# Patient Record
Sex: Female | Born: 1945 | Race: White | Hispanic: No | Smoking: Former smoker
Health system: Southern US, Community
[De-identification: ages and names within clinical notes are randomized; demographics above are authoritative.]

## PROBLEM LIST (undated history)

## (undated) DIAGNOSIS — I214 Non-ST elevation (NSTEMI) myocardial infarction: Secondary | ICD-10-CM

## (undated) DIAGNOSIS — E039 Hypothyroidism, unspecified: Secondary | ICD-10-CM

## (undated) DIAGNOSIS — I10 Essential (primary) hypertension: Secondary | ICD-10-CM

## (undated) DIAGNOSIS — R011 Cardiac murmur, unspecified: Secondary | ICD-10-CM

## (undated) DIAGNOSIS — R0602 Shortness of breath: Secondary | ICD-10-CM

## (undated) DIAGNOSIS — M1990 Unspecified osteoarthritis, unspecified site: Secondary | ICD-10-CM

## (undated) DIAGNOSIS — R911 Solitary pulmonary nodule: Secondary | ICD-10-CM

## (undated) DIAGNOSIS — H269 Unspecified cataract: Secondary | ICD-10-CM

## (undated) DIAGNOSIS — K219 Gastro-esophageal reflux disease without esophagitis: Secondary | ICD-10-CM

## (undated) DIAGNOSIS — E669 Obesity, unspecified: Secondary | ICD-10-CM

## (undated) DIAGNOSIS — J449 Chronic obstructive pulmonary disease, unspecified: Secondary | ICD-10-CM

## (undated) DIAGNOSIS — I5181 Takotsubo syndrome: Secondary | ICD-10-CM

## (undated) DIAGNOSIS — E785 Hyperlipidemia, unspecified: Secondary | ICD-10-CM

## (undated) HISTORY — PX: KNEE ARTHROSCOPY: SUR90

## (undated) HISTORY — DX: Takotsubo syndrome: I51.81

## (undated) HISTORY — DX: Unspecified cataract: H26.9

## (undated) HISTORY — PX: COLONOSCOPY: SHX174

## (undated) HISTORY — PX: BLADDER SUSPENSION: SHX72

## (undated) HISTORY — DX: Solitary pulmonary nodule: R91.1

## (undated) HISTORY — DX: Non-ST elevation (NSTEMI) myocardial infarction: I21.4

## (undated) HISTORY — DX: Unspecified osteoarthritis, unspecified site: M19.90

---
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
## 1967-08-28 HISTORY — PX: TONSILLECTOMY: SUR1361

## 1991-08-28 HISTORY — PX: PARTIAL HYSTERECTOMY: SHX80

## 2000-01-10 ENCOUNTER — Emergency Department (HOSPITAL_COMMUNITY): Admission: EM | Admit: 2000-01-10 | Discharge: 2000-01-10 | Payer: Self-pay | Admitting: Emergency Medicine

## 2001-01-24 ENCOUNTER — Encounter: Admission: RE | Admit: 2001-01-24 | Discharge: 2001-01-24 | Payer: Self-pay | Admitting: Emergency Medicine

## 2001-01-24 ENCOUNTER — Encounter: Payer: Self-pay | Admitting: Emergency Medicine

## 2001-03-05 ENCOUNTER — Encounter: Payer: Self-pay | Admitting: Emergency Medicine

## 2001-03-05 ENCOUNTER — Encounter: Admission: RE | Admit: 2001-03-05 | Discharge: 2001-03-05 | Payer: Self-pay | Admitting: Emergency Medicine

## 2001-09-30 ENCOUNTER — Encounter: Payer: Self-pay | Admitting: Emergency Medicine

## 2001-09-30 ENCOUNTER — Emergency Department (HOSPITAL_COMMUNITY): Admission: EM | Admit: 2001-09-30 | Discharge: 2001-09-30 | Payer: Self-pay | Admitting: Emergency Medicine

## 2001-11-24 ENCOUNTER — Encounter: Payer: Self-pay | Admitting: Emergency Medicine

## 2001-11-24 ENCOUNTER — Encounter: Admission: RE | Admit: 2001-11-24 | Discharge: 2001-11-24 | Payer: Self-pay | Admitting: Emergency Medicine

## 2002-11-16 ENCOUNTER — Encounter: Payer: Self-pay | Admitting: Internal Medicine

## 2002-11-16 ENCOUNTER — Inpatient Hospital Stay (HOSPITAL_COMMUNITY): Admission: EM | Admit: 2002-11-16 | Discharge: 2002-11-18 | Payer: Self-pay | Admitting: Emergency Medicine

## 2002-12-29 ENCOUNTER — Encounter: Admission: RE | Admit: 2002-12-29 | Discharge: 2002-12-29 | Payer: Self-pay | Admitting: Emergency Medicine

## 2002-12-29 ENCOUNTER — Encounter: Payer: Self-pay | Admitting: Emergency Medicine

## 2004-01-27 ENCOUNTER — Encounter: Admission: RE | Admit: 2004-01-27 | Discharge: 2004-01-27 | Payer: Self-pay | Admitting: Emergency Medicine

## 2005-10-04 ENCOUNTER — Encounter: Admission: RE | Admit: 2005-10-04 | Discharge: 2005-10-04 | Payer: Self-pay | Admitting: Emergency Medicine

## 2008-11-30 ENCOUNTER — Encounter (INDEPENDENT_AMBULATORY_CARE_PROVIDER_SITE_OTHER): Payer: Self-pay | Admitting: *Deleted

## 2009-01-23 ENCOUNTER — Encounter: Admission: RE | Admit: 2009-01-23 | Discharge: 2009-01-23 | Payer: Self-pay | Admitting: Orthopedic Surgery

## 2009-06-28 ENCOUNTER — Encounter: Admission: RE | Admit: 2009-06-28 | Discharge: 2009-06-28 | Payer: Self-pay | Admitting: Family Medicine

## 2009-08-27 DIAGNOSIS — I509 Heart failure, unspecified: Secondary | ICD-10-CM

## 2009-08-27 DIAGNOSIS — I214 Non-ST elevation (NSTEMI) myocardial infarction: Secondary | ICD-10-CM

## 2009-08-27 HISTORY — DX: Non-ST elevation (NSTEMI) myocardial infarction: I21.4

## 2009-08-27 HISTORY — PX: LUMBAR LAMINECTOMY: SHX95

## 2009-08-27 HISTORY — DX: Heart failure, unspecified: I50.9

## 2009-09-09 ENCOUNTER — Telehealth: Payer: Self-pay | Admitting: Internal Medicine

## 2009-09-15 ENCOUNTER — Ambulatory Visit: Payer: Self-pay | Admitting: Vascular Surgery

## 2010-02-06 ENCOUNTER — Encounter (INDEPENDENT_AMBULATORY_CARE_PROVIDER_SITE_OTHER): Payer: Self-pay | Admitting: *Deleted

## 2010-03-01 ENCOUNTER — Inpatient Hospital Stay (HOSPITAL_COMMUNITY): Admission: RE | Admit: 2010-03-01 | Discharge: 2010-03-03 | Payer: Self-pay | Admitting: Neurosurgery

## 2010-03-08 ENCOUNTER — Telehealth (INDEPENDENT_AMBULATORY_CARE_PROVIDER_SITE_OTHER): Payer: Self-pay | Admitting: *Deleted

## 2010-03-08 ENCOUNTER — Encounter (INDEPENDENT_AMBULATORY_CARE_PROVIDER_SITE_OTHER): Payer: Self-pay | Admitting: *Deleted

## 2010-04-06 ENCOUNTER — Encounter: Admission: RE | Admit: 2010-04-06 | Discharge: 2010-04-06 | Payer: Self-pay | Admitting: Neurosurgery

## 2010-06-14 ENCOUNTER — Encounter: Admission: RE | Admit: 2010-06-14 | Discharge: 2010-06-14 | Payer: Self-pay | Admitting: Neurosurgery

## 2010-08-17 ENCOUNTER — Encounter
Admission: RE | Admit: 2010-08-17 | Discharge: 2010-08-17 | Payer: Self-pay | Source: Home / Self Care | Attending: Neurosurgery | Admitting: Neurosurgery

## 2010-08-27 HISTORY — PX: BACK SURGERY: SHX140

## 2010-08-27 HISTORY — PX: CARDIAC CATHETERIZATION: SHX172

## 2010-09-17 ENCOUNTER — Encounter: Payer: Self-pay | Admitting: Family Medicine

## 2010-09-18 ENCOUNTER — Encounter: Payer: Self-pay | Admitting: Critical Care Medicine

## 2010-09-18 ENCOUNTER — Ambulatory Visit
Admission: RE | Admit: 2010-09-18 | Discharge: 2010-09-18 | Payer: Self-pay | Source: Home / Self Care | Attending: Critical Care Medicine | Admitting: Critical Care Medicine

## 2010-09-18 DIAGNOSIS — I10 Essential (primary) hypertension: Secondary | ICD-10-CM | POA: Insufficient documentation

## 2010-09-18 DIAGNOSIS — J449 Chronic obstructive pulmonary disease, unspecified: Secondary | ICD-10-CM

## 2010-09-18 DIAGNOSIS — J8410 Pulmonary fibrosis, unspecified: Secondary | ICD-10-CM | POA: Insufficient documentation

## 2010-09-18 DIAGNOSIS — J4489 Other specified chronic obstructive pulmonary disease: Secondary | ICD-10-CM | POA: Insufficient documentation

## 2010-09-18 DIAGNOSIS — E039 Hypothyroidism, unspecified: Secondary | ICD-10-CM | POA: Insufficient documentation

## 2010-09-18 DIAGNOSIS — E785 Hyperlipidemia, unspecified: Secondary | ICD-10-CM | POA: Insufficient documentation

## 2010-09-18 DIAGNOSIS — E876 Hypokalemia: Secondary | ICD-10-CM | POA: Insufficient documentation

## 2010-09-18 HISTORY — DX: Chronic obstructive pulmonary disease, unspecified: J44.9

## 2010-09-25 ENCOUNTER — Telehealth: Payer: Self-pay | Admitting: Critical Care Medicine

## 2010-09-26 NOTE — Progress Notes (Signed)
Summary: No Show Pv  Pt No Show for Previsit.  On Id on answering machine.  Colonoscopy for 03/21/2010 cancelled. No show letter mailed to pt.

## 2010-09-26 NOTE — Letter (Signed)
Summary: Previsit letter  [facility]  [address] [place], [place] [address]   Phone: [phone]  Fax: [phone]       02/06/2010 MRN: [id number]  Carolyn Faulkner [address] [place]  [address]  [place]  Dear [name],  Welcome to the Gastroenterology Division at [facility].    You are scheduled to see a nurse for your pre-procedure visit on 03/08/2010 at 10:30am on the 3rd floor at [facility], [address]  We ask that you try to arrive at our office 15 minutes prior to your appointment time to allow for check-in.  Your nurse visit will consist of discussing your medical and surgical history, your immediate family medical history, and your medications.    Please bring a complete list of all your medications or, if you prefer, bring the medication bottles and we will list them.  We will need to be aware of both prescribed and over the counter drugs.  We will need to know exact dosage information as well.  If you are on blood thinners (Coumadin, Plavix, Aggrenox, Ticlid, etc.) please call our office today/prior to your appointment, as we need to consult with your physician about holding your medication.   Please be prepared to read and sign documents such as consent forms, a financial agreement, and acknowledgement forms.  If necessary, and with your consent, a friend or relative is welcome to sit-in on the nurse visit with you.  Please bring your insurance card so that we may make a copy of it.  If your insurance requires a referral to see a specialist, please bring your referral form from your primary care physician.  No co-pay is required for this nurse visit.     If you cannot keep your appointment, please call [phone] to cancel or reschedule prior to your appointment date.  This allows [place] the opportunity to schedule an appointment for another patient in need of care.    Thank you for choosing [facility] for your medical needs.   We appreciate the opportunity to care for you.  Please visit [place] at our website  to learn more about our practice.                     Sincerely.                                                                                                                   The Gastroenterology Division

## 2010-09-26 NOTE — Letter (Signed)
Summary: Pre Visit No Show Letter  [facility]  [address] [place], [place] [address]   Phone: [phone]  Fax: [phone]        March 08, 2010 MRN: [id number]    Carolyn Faulkner [address], [place]  [address]    Dear [name],   We have been unable to reach you by phone concerning the pre-procedure visit that you missed on 03/08/2010. For this reason,your procedure scheduled on 03/21/2010 has been cancelled. Our scheduling staff will gladly assist you with rescheduling your appointments at a more convenient time. Please call our office at [phone] between the hours of 8:00am and 5:00pm, press option #2 to reach an appointment scheduler. Please consider updating your contact numbers at this time so that we can reach you by phone in the future with schedule changes or results.    Thank you,    Ezra Sites RN [facility]

## 2010-09-26 NOTE — Progress Notes (Signed)
Summary: Schedule Colonoscopy  Phone Note Outgoing Call Call back at Home Phone [phone]   Call placed by: Harlow Mares CMA Duncan Dull),  September 09, 2009 2:24 PM Call placed to: Patient Summary of Call: patient just has knee surgery and is having back surgery at the end of the month she would like our office to call her back in a few months. i will send myself a flag and call her in June 2011 Initial call taken by: Harlow Mares CMA Duncan Dull),  September 09, 2009 2:26 PM     Appended Document: Schedule Colonoscopy called pt back and she scheduled her colonoscopy for 03/21/2010.

## 2010-09-28 NOTE — Assessment & Plan Note (Signed)
Summary: Pulmonary Consultation   Copy to:  Dr. Burnell Blanks Primary Provider/Referring Provider:  Dr. Burnell Blanks  CC:  Pulmonary Consult.Marland Kitchen  History of Present Illness: Pulmonary Consultation       The patient comes in today for an initial general pulmonary consultation.  The patient complains of cough, coughing up sputum, shortness of breath, shortness of breath with exertion, wheezing, chest pain at rest, chest pain with exertion, chest pain with inspiration/coughing, sinus pressure, nasal congestion, excessive phlegm production, sore throat, hoarseness, fever, fatigue, gasping, and choking, but denies coughing up blood, chest congestion, PND, orthopnea, pedal edema, headache, heartburn, trouble swallowing, excessive snoring, daytime hypersomnolence, non-restorative sleep, and insomnia.  Patient notes dyspnea with walking on a flat surface, making beds, and while working.  The patient describes their cough as productive, with yellow sputum, copious amount, and paroxysmal.  The symptoms appear worse with with exposure to:Marland Kitchen  Treatment to date has included short acting bronchodilators: and oral corticosteroids:.  Evaluation to date has included CXR.  ill with sinus issues b4 xmas, urgent care after xmas ?bronchitis then to PCP and cxr >>>pneumonitis mold seen in closet with leaky hot water.  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    quit smoking 2004 and was ok until 2011 had 4 bouts of bronchitis mold   had been an issue before, then recurred. now has been abated  rx pred and avelox and finished two weeks ago and helped swelling better off prednisone current symptoms resolved pcp wanted cxr checked    Preventive Screening-Counseling & Management  Alcohol-Tobacco     Smoking Status: quit > 6 months     Year Quit: 2004     Pack years: 1  Current Medications (verified): 1)  Synthroid 125 Mcg Tabs (Levothyroxine Sodium) .... Take 1 Tablet By Mouth Once A Day 2)   Zolpidem Tartrate 10 Mg Tabs (Zolpidem Tartrate) .... Take 1 Tab By Mouth At Bedtime As Needed Insomnia 3)  Hyzaar 100-25 Mg Tabs (Losartan Potassium-Hctz) .... Take 1 Tablet By Mouth Once A Day 4)  Klor-Con 10 10 Meq Cr-Tabs (Potassium Chloride) .... Take 1 Tablet By Mouth Once A Day 5)  Pravastatin Sodium 40 Mg Tabs (Pravastatin Sodium) .... 2 Tabets At Bedtime 6)  Amlodipine Besylate 5 Mg Tabs (Amlodipine Besylate) .... Take 1 Tablet By Mouth Once A Day 7)  Valium 5 Mg Tabs (Diazepam) .Marland Kitchen.. 1-2 Tablets By Mouth Every 6 Hours As Needed 8)  Ventolin Hfa 108 (90 Base) Mcg/act Aers (Albuterol Sulfate) .... 2 Puffs Every 4 Hours As Needed 9)  Cyclobenzaprine Hcl 10 Mg Tabs (Cyclobenzaprine Hcl) .... Take 1 Tablet By Mouth Three Times A Day As Needed  Allergies (verified): 1)  ! Indocin 2)  Codeine 3)  Amitriptyline Hcl 4)  Darvocet  Past History:  Past medical, surgical, family and social histories (including risk factors) reviewed, and no changes noted (except as noted below).  Past Medical History: HYPOKALEMIA (ICD-276.8) HYPERLIPIDEMIA (ICD-272.4) HYPOTHYROIDISM (ICD-244.9) HYPERTENSION (ICD-401.9)  Past Surgical History: spinal fusion - lower back bladder tack x 2 blader sling x 1 Tonsillectomy partial hysterectomy left knee surgery 1998, 2010  Family History: Reviewed history from 09/15/2010 and no changes required. Father - deceased, emphysema Mother - deceased, lung ca sister1 - heart desease, deceased sister 2 - leukemia, deceased brother 1 - lung ca, deceased brother 2 - lung and brain tumors, deceased brother 3 - pancreatic ca, deceased  Social History: Reviewed history from 09/15/2010 and no changes  required. Former smoker.  Quit 2004.  2 1/2 - 3 ppd.  Started at age 89. lives witt brother Retired from [employer] 3 childrenSmoking Status:  quit > 6 months Pack years:  46  Review of Systems       The patient complains of shortness of breath with activity,  productive cough, non-productive cough, chest pain, tooth/dental problems, nasal congestion/difficulty breathing through nose, hand/feet swelling, joint stiffness or pain, and fever.  The patient denies shortness of breath at rest, coughing up blood, irregular heartbeats, acid heartburn, indigestion, loss of appetite, weight change, abdominal pain, difficulty swallowing, sore throat, headaches, sneezing, itching, ear ache, anxiety, depression, rash, and change in color of mucus.    Vital Signs:  Patient profile:   65 year old female Height:      70 inches Weight:      255.25 pounds BMI:     36.76 O2 Sat:      96 % on Room air Temp:     98.2 degrees F oral Pulse rate:   74 / minute BP sitting:   120 / 72  (right arm) Cuff size:   large  Vitals Entered By: Gweneth Dimitri RN (September 18, 2010 11:02 AM)  O2 Flow:  Room air CC: Pulmonary Consult. Comments Medications reviewed with patient Daytime contact number verified with patient. Crystal Jones RN  September 18, 2010 11:02 AM    Physical Exam  Additional Exam:  Gen: Pleasant, well-nourished, in no distress,  normal affect ENT: No lesions,  mouth clear,  oropharynx clear, no postnasal drip Neck: No JVD, no TMG, no carotid bruits Lungs: No use of accessory muscles, no dullness to percussion, distant BS Cardiovascular: RRR, heart sounds normal, no murmur or gallops, no peripheral edema Abdomen: soft and NT, no HSM,  BS normal Musculoskeletal: No deformities, no cyanosis or clubbing Neuro: alert, non focal Skin: Warm, no lesions or rashes    CXR  Procedure date:  09/18/2010  Findings:      IMPRESSION: No acute disease.    Impression & Recommendations:  Problem # 1:  COPD (ICD-496) Assessment Unchanged Copd with obstructive defect, no evident for pneumonitis on cxr at this time plan start spiriva as needed ventolin  Medications Added to Medication List This Visit: 1)  Valium 5 Mg Tabs (Diazepam) .Marland Kitchen.. 1-2 tablets by  mouth every 6 hours as needed 2)  Cyclobenzaprine Hcl 10 Mg Tabs (Cyclobenzaprine hcl) .... Take 1 tablet by mouth three times a day as needed 3)  Spiriva Handihaler 18 Mcg Caps (Tiotropium bromide monohydrate) .... Two puffs in handihaler daily  Complete Medication List: 1)  Synthroid 125 Mcg Tabs (Levothyroxine sodium) .... Take 1 tablet by mouth once a day 2)  Zolpidem Tartrate 10 Mg Tabs (Zolpidem tartrate) .... Take 1 tab by mouth at bedtime as needed insomnia 3)  Hyzaar 100-25 Mg Tabs (Losartan potassium-hctz) .... Take 1 tablet by mouth once a day 4)  Klor-con 10 10 Meq Cr-tabs (Potassium chloride) .... Take 1 tablet by mouth once a day 5)  Pravastatin Sodium 40 Mg Tabs (Pravastatin sodium) .... 2 tabets at bedtime 6)  Amlodipine Besylate 5 Mg Tabs (Amlodipine besylate) .... Take 1 tablet by mouth once a day 7)  Valium 5 Mg Tabs (Diazepam) .Marland Kitchen.. 1-2 tablets by mouth every 6 hours as needed 8)  Ventolin Hfa 108 (90 Base) Mcg/act Aers (Albuterol sulfate) .... 2 puffs every 4 hours as needed 9)  Cyclobenzaprine Hcl 10 Mg Tabs (Cyclobenzaprine hcl) .... Take 1 tablet  by mouth three times a day as needed 10)  Spiriva Handihaler 18 Mcg Caps (Tiotropium bromide monohydrate) .... Two puffs in handihaler daily  Other Orders: Spirometry w/Graph (04540) New Patient Level V (98119) T-Fungal Panel Immuno (86612/[id number]) T-Hypersens Panel (86331/[id number]) T-2 View CXR (71020TC)  Patient Instructions: 1)  Start Spiriva daily,  one capsule, two puffs 2)  Use ventolin as needed 3)  A chest xray will be obtained 4)  Labs today for mold exposure 5)  Return 2 months  Prescriptions: SPIRIVA HANDIHALER 18 MCG  CAPS (TIOTROPIUM BROMIDE MONOHYDRATE) Two puffs in handihaler daily  #30 x 6   Entered and Authorized by:   Storm Frisk MD   Signed by:   Storm Frisk MD on 09/18/2010   Method used:   Electronically to        [facility] [phone]* (retail)       [address]       [place], [place]  [address]       Ph: [phone] or [phone]       Fax: [phone]   RxID:   [id number]    Immunization History:  Pneumovax Immunization History:    Pneumovax:  historical (02/24/2010)   Appended Document: Pulmonary Consultation fax Burnell Blanks

## 2010-10-04 NOTE — Progress Notes (Signed)
Summary: Lab results   Phone Note Outgoing Call   Reason for Call: Discuss lab or test results Summary of Call: call the pt and tell her all allergy lab reports are negative stay on medications as prescribed  Initial call taken by: Storm Frisk MD,  September 25, 2010 12:22 PM  Follow-up for Phone Call        [facility]  Gweneth Dimitri RN  September 26, 2010 11:01 AM Ms Melvyn Neth phoned stated that she was returning a call from [place] she can be reached at [phone]. Marland KitchenVedia Coffer  September 26, 2010 3:25 PM   Additional Follow-up for Phone Call Additional follow up Details #1::        called, spoke with pt.  She was informed of above lab results and recs per PW.  She verbalized understanding. Additional Follow-up by: Gweneth Dimitri RN,  September 26, 2010 4:11 PM

## 2010-11-10 ENCOUNTER — Ambulatory Visit: Payer: Self-pay | Admitting: Critical Care Medicine

## 2010-11-12 LAB — DIFFERENTIAL
Basophils Relative: 0 % (ref 0–1)
Eosinophils Relative: 1 % (ref 0–5)
Lymphocytes Relative: 27 % (ref 12–46)
Monocytes Relative: 5 % (ref 3–12)
Neutro Abs: 8 10*3/uL — ABNORMAL HIGH (ref 1.7–7.7)

## 2010-11-12 LAB — CBC
MCH: 31.5 pg (ref 26.0–34.0)
MCHC: 34.2 g/dL (ref 30.0–36.0)
Platelets: 244 10*3/uL (ref 150–400)
RDW: 12.6 % (ref 11.5–15.5)

## 2010-11-12 LAB — ABO/RH: ABO/RH(D): O POS

## 2010-11-12 LAB — SURGICAL PCR SCREEN
MRSA, PCR: NEGATIVE
Staphylococcus aureus: NEGATIVE

## 2010-11-12 LAB — TYPE AND SCREEN: Antibody Screen: NEGATIVE
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
## 2011-01-12 NOTE — Discharge Summary (Signed)
NAMEHAYVEN, Carolyn Faulkner NO.:  [id number]   MEDICAL RECORD NO.:  [id number]                   PATIENT TYPE:  INP   LOCATION:  3734                                 FACILITY:  [facility]   PHYSICIAN:  Duke Salvia, M.D. [facility]           DATE OF BIRTH:  1946-05-11   DATE OF ADMISSION:  11/16/2002  DATE OF DISCHARGE:  11/18/2002                                 DISCHARGE SUMMARY   DISCHARGE DIAGNOSIS:  Atypical chest pain.   SECONDARY DIAGNOSES:  1. Hypertension.  2. Osteoarthritis.  3. Hyperlipidemia.   HISTORY OF PRESENT ILLNESS:  This is a 65 year old female who was referred  for evaluation of chest pain over the last several months.  For six months,  the patient noted chest pressure with exertion.  She states that she  developed left-sided chest pressure walking up an incline, stops because of  the pressure.  After five minutes, it eases off.  She does not take anything  for this.  Occasionally when going up stairs, she notes similar symptoms.  The evening prior to admission, she awoke at 1 a.m. and had some chest  pressures.  Denies a bitter taste in her mouth.  Not like a burning  sensation substernally that she has with reflux.  Episode eased off on its  on the morning of admission and she felt okay.  However, a couple of times  during the day, she had chest pressure left side.  One occurred in the  waiting area where she felt somewhat nauseated and tried to make herself  belch and burp which seemed to improve.  Of note, the patient has had some  reflux and burning sensation substernally.  However, again she has no  bitterness in her mouth.  This was relieved by Rolaids, however, it is  different.  The patient is noted to drink three to six beers per day.   HOSPITAL COURSE:  She was admitted to [facility] and underwent a  cardiac catheterization which showed normal coronary and an EF of 65%.  Aortic root showed no dissection of aneurysm.  The  patient's cholesterol  level was noted to be increased at 251.  Triglycerides was 181.  HDL was 63  and LDL was 52.  Her Lipitor dose was increased from 20 mg to 40 mg daily.  The patient was also noted to be hypertensive, and she was placed on Mavik 1  mg daily for that as a proton pump inhibitor.   DISCHARGE MEDICATIONS:  She was discharged to home on all of her previous  medications:  1. Hydrochlorothiazide 25 mg daily.  2. Coated aspirin 81 mg daily.  3. Lipitor 40 mg daily.  4. Mavik one daily.  5. Protonix 40 mg daily.  6. She is to take Tylenol one or two tablets every four to six hours as     needed.  DISCHARGE INSTRUCTIONS:  No heavy lifting or strenuous activities for two  days.  No driving for two days.  Low fat, low salt, low cholesterol diet.  She was to call if she developed a lump in her groin or any drainage and  report in with scheduled visit with physician's assistant and [place] on  December 03, 2002, at 10:30 a.m.  She was scheduled an appointment with Dr.  Ladona Ridgel within one to two weeks and to have a BMET drawn at Dr. Melanee Spry  office in one week to follow her creatinine with the initiation of Mavik.  The patient was encouraged to stop the alcohol.     Chinita Pester, C.R.N.P. [facility]                 Duke Salvia, M.D. [facility]    DS/MEDQ  D:  11/18/2002  T:  11/19/2002  Job:  [id number]   cc:   Reuben Likes, M.D.  [address]  [place]  [place] [address]  Fax: [phone]   Pricilla Riffle, M.D. [facility]

## 2011-01-12 NOTE — Cardiovascular Report (Signed)
   NAMEMYCHAL, DURIO NO.:  [id number]   MEDICAL RECORD NO.:  [id number]                   PATIENT TYPE:  INP   LOCATION:  3734                                 FACILITY:  [facility]   PHYSICIAN:  Charlton Haws, M.D. [facility]              DATE OF BIRTH:  [birth year]   DATE OF PROCEDURE:  DATE OF DISCHARGE:                              CARDIAC CATHETERIZATION   PROCEDURE:  Coronary angiography.   IMPRESSION:  Chest pain, multiple coronary risk factors.  Same-day  catheterization was done from the right femoral artery.  The patient's blood  pressure was somewhat elevated during the case at 172/92.  She was given a  total of 2.5 mg of IV enalaprilat.   The left main coronary artery was normal.   The left anterior descending. Artery was normal.   The circumflex coronary artery was normal.   The right coronary artery was dominant and normal.   RAO VENTRICULOGRAPHY:  RAO ventriculography showed an EF of 65% with no MR.  There was no gradient across the aortic valve.  Aortic pressure after  enalapril was 160/90.  LV pressure was 153/60.   Because of the patient's chest pain and elevated blood pressure, an aortic  root injection was performed to rule out aneurysm and dissection.  There  showed mild aortic root dilatation but no evidence of atheroma, dissection,  or plaque.   IMPRESSION:  The patient's chest pain would appear to be noncardiac in  etiology due to her alcohol history.  It may be worthwhile to perform  outpatient endoscopy.   She needs better control of her triglycerides and lipids.  I suspect her  Lipitor can be increased to 40 mg a day.   If her blood pressure remains elevated, I would add 5 mg of Altace to her  outpatient regimen in addition to her diuretics.   If her leg heals well, she will be discharged in the morning to follow up  with Dr. Lorenz Coaster.                                               Charlton Haws, M.D. [facility]    PN/MEDQ  D:   11/17/2002  T:  11/18/2002  Job:  [id number]   cc:   Reuben Likes, M.D.  [address]  [place]  [place] [address]  Fax: [phone]   Dr. Tenny Craw

## 2011-02-19 ENCOUNTER — Other Ambulatory Visit: Payer: Self-pay | Admitting: Family Medicine

## 2011-02-19 DIAGNOSIS — Z1231 Encounter for screening mammogram for malignant neoplasm of breast: Secondary | ICD-10-CM

## 2011-02-27 ENCOUNTER — Ambulatory Visit
Admission: RE | Admit: 2011-02-27 | Discharge: 2011-02-27 | Disposition: A | Discharge status: Home / Self Care | Payer: 59 | Source: Ambulatory Visit | Attending: Family Medicine | Admitting: Family Medicine

## 2011-02-27 DIAGNOSIS — Z1231 Encounter for screening mammogram for malignant neoplasm of breast: Secondary | ICD-10-CM

## 2011-05-23 ENCOUNTER — Other Ambulatory Visit: Payer: Self-pay | Admitting: Neurosurgery

## 2011-05-23 DIAGNOSIS — M545 Low back pain: Secondary | ICD-10-CM

## 2011-05-26 ENCOUNTER — Other Ambulatory Visit: Payer: 59

## 2011-05-30 ENCOUNTER — Ambulatory Visit
Admission: RE | Admit: 2011-05-30 | Discharge: 2011-05-30 | Disposition: A | Discharge status: Home / Self Care | Payer: 59 | Source: Ambulatory Visit | Attending: Neurosurgery | Admitting: Neurosurgery

## 2011-05-30 DIAGNOSIS — M545 Low back pain: Secondary | ICD-10-CM

## 2011-05-30 MED ORDER — GADOBENATE DIMEGLUMINE 529 MG/ML IV SOLN
20.0000 mL | Freq: Once | INTRAVENOUS | Status: AC | PRN
  Administered 2011-05-30: 20 mL via INTRAVENOUS

## 2011-07-28 ENCOUNTER — Encounter: Payer: Self-pay | Admitting: *Deleted

## 2011-07-28 ENCOUNTER — Other Ambulatory Visit: Payer: Self-pay

## 2011-07-28 ENCOUNTER — Emergency Department (HOSPITAL_COMMUNITY): Payer: 59

## 2011-07-28 ENCOUNTER — Inpatient Hospital Stay (HOSPITAL_COMMUNITY)
Admission: EM | Admit: 2011-07-28 | Discharge: 2011-08-02 | DRG: 281 | Disposition: A | Discharge status: Home / Self Care | Payer: 59 | Attending: Internal Medicine | Admitting: Internal Medicine

## 2011-07-28 DIAGNOSIS — E785 Hyperlipidemia, unspecified: Secondary | ICD-10-CM | POA: Insufficient documentation

## 2011-07-28 DIAGNOSIS — M5137 Other intervertebral disc degeneration, lumbosacral region: Secondary | ICD-10-CM | POA: Diagnosis present

## 2011-07-28 DIAGNOSIS — M51379 Other intervertebral disc degeneration, lumbosacral region without mention of lumbar back pain or lower extremity pain: Secondary | ICD-10-CM | POA: Diagnosis present

## 2011-07-28 DIAGNOSIS — J440 Chronic obstructive pulmonary disease with acute lower respiratory infection: Secondary | ICD-10-CM | POA: Diagnosis present

## 2011-07-28 DIAGNOSIS — J209 Acute bronchitis, unspecified: Secondary | ICD-10-CM | POA: Diagnosis present

## 2011-07-28 DIAGNOSIS — R911 Solitary pulmonary nodule: Secondary | ICD-10-CM

## 2011-07-28 DIAGNOSIS — E079 Disorder of thyroid, unspecified: Secondary | ICD-10-CM | POA: Diagnosis present

## 2011-07-28 DIAGNOSIS — M519 Unspecified thoracic, thoracolumbar and lumbosacral intervertebral disc disorder: Secondary | ICD-10-CM | POA: Insufficient documentation

## 2011-07-28 DIAGNOSIS — I214 Non-ST elevation (NSTEMI) myocardial infarction: Principal | ICD-10-CM | POA: Diagnosis present

## 2011-07-28 DIAGNOSIS — J449 Chronic obstructive pulmonary disease, unspecified: Secondary | ICD-10-CM | POA: Insufficient documentation

## 2011-07-28 DIAGNOSIS — Z6830 Body mass index (BMI) 30.0-30.9, adult: Secondary | ICD-10-CM

## 2011-07-28 DIAGNOSIS — I10 Essential (primary) hypertension: Secondary | ICD-10-CM | POA: Diagnosis present

## 2011-07-28 DIAGNOSIS — E669 Obesity, unspecified: Secondary | ICD-10-CM | POA: Diagnosis present

## 2011-07-28 DIAGNOSIS — J8410 Pulmonary fibrosis, unspecified: Secondary | ICD-10-CM | POA: Diagnosis present

## 2011-07-28 DIAGNOSIS — Z87891 Personal history of nicotine dependence: Secondary | ICD-10-CM

## 2011-07-28 DIAGNOSIS — J4489 Other specified chronic obstructive pulmonary disease: Secondary | ICD-10-CM | POA: Insufficient documentation

## 2011-07-28 DIAGNOSIS — E039 Hypothyroidism, unspecified: Secondary | ICD-10-CM | POA: Diagnosis present

## 2011-07-28 DIAGNOSIS — R079 Chest pain, unspecified: Secondary | ICD-10-CM

## 2011-07-28 HISTORY — DX: Obesity, unspecified: E66.9

## 2011-07-28 HISTORY — DX: Hyperlipidemia, unspecified: E78.5

## 2011-07-28 HISTORY — DX: Essential (primary) hypertension: I10

## 2011-07-28 HISTORY — DX: Hypothyroidism, unspecified: E03.9

## 2011-07-28 HISTORY — DX: Chronic obstructive pulmonary disease, unspecified: J44.9

## 2011-07-28 HISTORY — DX: Shortness of breath: R06.02

## 2011-07-28 LAB — CBC
HCT: 41.1 % (ref 36.0–46.0)
Hemoglobin: 14.7 g/dL (ref 12.0–15.0)
MCH: 30.8 pg (ref 26.0–34.0)
MCHC: 35.8 g/dL (ref 30.0–36.0)
MCHC: 35.5 g/dL (ref 30.0–36.0)
MCV: 86 fL (ref 78.0–100.0)
Platelets: 205 10*3/uL (ref 150–400)
Platelets: 243 10*3/uL (ref 150–400)
RBC: 4.78 MIL/uL (ref 3.87–5.11)
RDW: 12.4 % (ref 11.5–15.5)
RDW: 12.6 % (ref 11.5–15.5)
WBC: 14.1 10*3/uL — ABNORMAL HIGH (ref 4.0–10.5)

## 2011-07-28 LAB — COMPREHENSIVE METABOLIC PANEL
Albumin: 3.9 g/dL (ref 3.5–5.2)
Alkaline Phosphatase: 115 U/L (ref 39–117)
BUN: 9 mg/dL (ref 6–23)
Potassium: 3.7 mEq/L (ref 3.5–5.1)
Sodium: 138 mEq/L (ref 135–145)
Total Protein: 7.6 g/dL (ref 6.0–8.3)

## 2011-07-28 LAB — BASIC METABOLIC PANEL
BUN: 10 mg/dL (ref 6–23)
CO2: 24 mEq/L (ref 19–32)
Calcium: 9.3 mg/dL (ref 8.4–10.5)
Chloride: 103 mEq/L (ref 96–112)
Creatinine, Ser: 0.67 mg/dL (ref 0.50–1.10)
GFR calc Af Amer: >90 mL/min (ref >90)
GFR calc non Af Amer: >90 mL/min (ref >90)
Glucose, Bld: 117 mg/dL — ABNORMAL HIGH (ref 70–99)
Potassium: 3.2 mEq/L — ABNORMAL LOW (ref 3.5–5.1)
Sodium: 141 mEq/L (ref 135–145)

## 2011-07-28 LAB — DIFFERENTIAL
Basophils Absolute: 0 10*3/uL (ref 0.0–0.1)
Basophils Relative: 0 % (ref 0–1)
Monocytes Absolute: 1 10*3/uL (ref 0.1–1.0)
Neutro Abs: 12.9 10*3/uL — ABNORMAL HIGH (ref 1.7–7.7)
Neutrophils Relative %: 76 % (ref 43–77)

## 2011-07-28 LAB — CARDIAC PANEL(CRET KIN+CKTOT+MB+TROPI)
Relative Index: 10.9 — ABNORMAL HIGH (ref 0.0–2.5)
Total CK: 114 U/L (ref 7–177)
Troponin I: 1.67 ng/mL (ref <0.30)

## 2011-07-28 LAB — APTT: aPTT: 30 seconds (ref 24–37)

## 2011-07-28 LAB — PROTIME-INR: INR: 1 (ref 0.00–1.49)

## 2011-07-28 LAB — TROPONIN I: Troponin I: <0.3 ng/mL (ref <0.30)

## 2011-07-28 MED ORDER — LEVOTHYROXINE SODIUM 125 MCG PO TABS
125.0000 ug | ORAL_TABLET | Freq: Every day | ORAL | Status: DC
  Administered 2011-07-29 – 2011-08-02 (×5): 125 ug via ORAL
  Filled 2011-07-28 (×6): qty 1

## 2011-07-28 MED ORDER — ZOLPIDEM TARTRATE 5 MG PO TABS
5.0000 mg | ORAL_TABLET | Freq: Every evening | ORAL | Status: DC | PRN
  Administered 2011-07-31 – 2011-08-01 (×2): 5 mg via ORAL
  Filled 2011-07-28 (×3): qty 1

## 2011-07-28 MED ORDER — ASPIRIN EC 81 MG PO TBEC
81.0000 mg | DELAYED_RELEASE_TABLET | Freq: Every day | ORAL | Status: DC
  Administered 2011-07-29 – 2011-08-02 (×4): 81 mg via ORAL
  Filled 2011-07-28 (×5): qty 1

## 2011-07-28 MED ORDER — POTASSIUM CHLORIDE 20 MEQ/15ML (10%) PO LIQD
ORAL | Status: AC
  Administered 2011-07-28: 40 meq via ORAL
  Filled 2011-07-28: qty 30

## 2011-07-28 MED ORDER — LOSARTAN POTASSIUM 50 MG PO TABS
100.0000 mg | ORAL_TABLET | Freq: Every day | ORAL | Status: DC
  Administered 2011-07-29 – 2011-08-02 (×5): 100 mg via ORAL
  Filled 2011-07-28 (×6): qty 2

## 2011-07-28 MED ORDER — NITROGLYCERIN 0.4 MG SL SUBL: 0.4000 mg | SUBLINGUAL_TABLET | SUBLINGUAL | Status: DC | PRN

## 2011-07-28 MED ORDER — POTASSIUM CHLORIDE 20 MEQ/15ML (10%) PO LIQD
40.0000 meq | Freq: Every day | ORAL | Status: DC
  Administered 2011-07-28 – 2011-07-29 (×2): 40 meq via ORAL
  Filled 2011-07-28: qty 30

## 2011-07-28 MED ORDER — POTASSIUM CHLORIDE CRYS ER 20 MEQ PO TBCR: 20.0000 meq | EXTENDED_RELEASE_TABLET | Freq: Two times a day (BID) | ORAL | Status: DC

## 2011-07-28 MED ORDER — ALBUTEROL SULFATE HFA 108 (90 BASE) MCG/ACT IN AERS
2.0000 | INHALATION_SPRAY | Freq: Four times a day (QID) | RESPIRATORY_TRACT | Status: DC | PRN
  Administered 2011-07-28: 2 via RESPIRATORY_TRACT
  Filled 2011-07-28: qty 6.7

## 2011-07-28 MED ORDER — HYDROCHLOROTHIAZIDE 25 MG PO TABS
25.0000 mg | ORAL_TABLET | Freq: Every day | ORAL | Status: DC
  Administered 2011-07-29 – 2011-08-01 (×4): 25 mg via ORAL
  Filled 2011-07-28 (×5): qty 1

## 2011-07-28 MED ORDER — OXYCODONE-ACETAMINOPHEN 5-325 MG PO TABS: 1.0000 | ORAL_TABLET | ORAL | Status: DC | PRN

## 2011-07-28 MED ORDER — SIMVASTATIN 40 MG PO TABS
40.0000 mg | ORAL_TABLET | Freq: Every day | ORAL | Status: DC
  Administered 2011-07-29 – 2011-08-01 (×4): 40 mg via ORAL
  Filled 2011-07-28 (×5): qty 1

## 2011-07-28 MED ORDER — TIOTROPIUM BROMIDE MONOHYDRATE 18 MCG IN CAPS
18.0000 ug | ORAL_CAPSULE | Freq: Every day | RESPIRATORY_TRACT | Status: DC
  Administered 2011-07-29 – 2011-08-01 (×3): 18 ug via RESPIRATORY_TRACT
  Filled 2011-07-28: qty 5

## 2011-07-28 MED ORDER — ACETAMINOPHEN 325 MG PO TABS
325.0000 mg | ORAL_TABLET | Freq: Four times a day (QID) | ORAL | Status: DC | PRN
  Administered 2011-07-29 (×2): 325 mg via ORAL
  Filled 2011-07-28 (×3): qty 1

## 2011-07-28 MED ORDER — HEPARIN BOLUS VIA INFUSION
4000.0000 [IU] | Freq: Once | INTRAVENOUS | Status: AC
  Administered 2011-07-28: 4000 [IU] via INTRAVENOUS
  Filled 2011-07-28: qty 4000

## 2011-07-28 MED ORDER — DOXYCYCLINE HYCLATE 100 MG PO TABS
100.0000 mg | ORAL_TABLET | Freq: Every day | ORAL | Status: DC
  Administered 2011-07-28 – 2011-07-29 (×2): 100 mg via ORAL
  Filled 2011-07-28 (×2): qty 1

## 2011-07-28 MED ORDER — HEPARIN SOD (PORCINE) IN D5W 100 UNIT/ML IV SOLN
1300.0000 [IU]/h | INTRAVENOUS | Status: DC
  Administered 2011-07-28: 1200 [IU]/h via INTRAVENOUS
  Administered 2011-07-29: 1300 [IU]/h via INTRAVENOUS
  Filled 2011-07-28 (×4): qty 250

## 2011-07-28 MED ORDER — ASPIRIN 81 MG PO CHEW: 324.0000 mg | CHEWABLE_TABLET | ORAL | Status: AC

## 2011-07-28 MED ORDER — LOSARTAN POTASSIUM-HCTZ 100-25 MG PO TABS: 1.0000 | ORAL_TABLET | Freq: Every day | ORAL | Status: DC

## 2011-07-28 MED ORDER — MORPHINE SULFATE 4 MG/ML IJ SOLN
4.0000 mg | Freq: Once | INTRAMUSCULAR | Status: AC
  Administered 2011-07-28: 4 mg via INTRAVENOUS
  Filled 2011-07-28: qty 1

## 2011-07-28 MED ORDER — ONDANSETRON HCL 4 MG/2ML IJ SOLN: 4.0000 mg | Freq: Four times a day (QID) | INTRAMUSCULAR | Status: DC | PRN

## 2011-07-28 MED ORDER — ASPIRIN 300 MG RE SUPP
300.0000 mg | RECTAL | Status: AC
  Filled 2011-07-28: qty 1

## 2011-07-28 NOTE — ED Notes (Signed)
EMS VSS 120-140 sbp

## 2011-07-28 NOTE — ED Notes (Signed)
Pt is eating at this time. Pt stated that her pain was coming back. RN has been informed.

## 2011-07-28 NOTE — ED Notes (Signed)
Non productive cough for a few weeks.

## 2011-07-28 NOTE — ED Notes (Signed)
Patient is resting comfortably. Pt moved from pda to yellow. No distress noted. Food has been ordered for pt. She is aware of delay, waiting on bed. resp e/u. Skin w/d. Mae 4.
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
## 2011-07-28 NOTE — Progress Notes (Signed)
Rhonda Barrett notified of Troponin 1.67.  Patient currently pain free.  Awaiting heparin gtt from pharmacy.  Will continue to monitor.  Alonza Bogus 07/28/11 10:18 PM

## 2011-07-28 NOTE — Progress Notes (Signed)
ANTICOAGULATION CONSULT NOTE - Initial Consult  Pharmacy Consult for Heparin Indication: Chest pain  Allergies  Allergen Reactions  . Amitriptyline Hcl     REACTION: rash  . Codeine     REACTION: nausea, itching  . Hydrocodone Itching  . Indomethacin     REACTION: extreme pain  . Propoxyphene N-Acetaminophen     REACTION: itching    Patient Measurements: Height: 5\' 9"  (175.3 cm) Weight: 236 lb 5.3 oz (107.2 kg) IBW/kg (Calculated) : 66.2  Adjusted weight: 90kg  Vital Signs: Temp: 97.6 F (36.4 C) (12/01 2010) Temp src: Oral (12/01 2010) BP: 141/92 mmHg (12/01 2010) Pulse Rate: 88  (12/01 2010)  Labs:  [facility] 07/28/11 1238  HGB 14.7  HCT 41.1  PLT 205  APTT --  LABPROT --  INR --  HEPARINUNFRC --  CREATININE 0.67  CKTOTAL --  CKMB --  TROPONINI <0.30   Estimated Creatinine Clearance: 92.6 ml/min (by C-G formula based on Cr of 0.67).  Medical History: Past Medical History  Diagnosis Date  . COPD (chronic obstructive pulmonary disease)   . Thyroid disease   . Hypertension   . Hyperlipidemia   . Hypokalemia   . Obesity (BMI 30-39.9)   . COPD 09/18/2010  . Hyperlipidemia   . Hypertension   . Hypokalemia     Medications:  Scheduled:    . aspirin  324 mg Oral NOW   Or  . aspirin  300 mg Rectal NOW  . aspirin EC  81 mg Oral Daily  . doxycycline  100 mg Oral Daily  . hydrochlorothiazide  25 mg Oral Daily  . levothyroxine  125 mcg Oral Daily  . losartan  100 mg Oral Daily  .  morphine injection  4 mg Intravenous Once  . potassium chloride  40 mEq Oral Daily  . potassium chloride  20 mEq Oral BID  . simvastatin  40 mg Oral q1800  . tiotropium  18 mcg Inhalation Daily  . DISCONTD: losartan-hydrochlorothiazide  1 tablet Oral Daily    Assessment: 65yo female admitted with chest pain.  There are no baseline coags, but no h/o bleeding problems.  CBC is wnl (except incr WBC & recent URI).  Goal of Therapy:  Heparin level 0.3-0.7 units/ml   Plan:   1.  Heparin 4000 units IV x 1, then 1200 units/hr 2.  Check HL & CBC 6hr 3.  Daily HL, CBC  Ilean Spradlin P 07/28/2011,8:54 PM

## 2011-07-28 NOTE — ED Notes (Signed)
Pt placed on monitor, cont. Pulse ox. Dr.Kohut to eval ecg.

## 2011-07-28 NOTE — ED Notes (Signed)
Pt return from xray. Placed back on monitor.
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
## 2011-07-29 ENCOUNTER — Other Ambulatory Visit: Payer: Self-pay

## 2011-07-29 DIAGNOSIS — R079 Chest pain, unspecified: Secondary | ICD-10-CM

## 2011-07-29 LAB — BASIC METABOLIC PANEL
BUN: 8 mg/dL (ref 6–23)
CO2: 28 mEq/L (ref 19–32)
Calcium: 9.6 mg/dL (ref 8.4–10.5)
Calcium: 9.5 mg/dL (ref 8.4–10.5)
Chloride: 101 mEq/L (ref 96–112)
Creatinine, Ser: 0.71 mg/dL (ref 0.50–1.10)
GFR calc Af Amer: >90 mL/min (ref >90)
Glucose, Bld: 124 mg/dL — ABNORMAL HIGH (ref 70–99)
Sodium: 136 mEq/L (ref 135–145)

## 2011-07-29 LAB — CARDIAC PANEL(CRET KIN+CKTOT+MB+TROPI)
CK, MB: 8.1 ng/mL (ref 0.3–4.0)
Relative Index: 11 — ABNORMAL HIGH (ref 0.0–2.5)
Relative Index: INVALID (ref 0.0–2.5)
Total CK: 115 U/L (ref 7–177)
Total CK: 84 U/L (ref 7–177)
Troponin I: 1.57 ng/mL (ref <0.30)
Troponin I: 0.88 ng/mL (ref <0.30)

## 2011-07-29 LAB — D-DIMER, QUANTITATIVE: D-Dimer, Quant: 0.6 ug/mL-FEU — ABNORMAL HIGH (ref 0.00–0.48)

## 2011-07-29 LAB — CBC
HCT: 44.1 % (ref 36.0–46.0)
HCT: 42.9 % (ref 36.0–46.0)
Hemoglobin: 14.9 g/dL (ref 12.0–15.0)
MCH: 30 pg (ref 26.0–34.0)
MCHC: 34.7 g/dL (ref 30.0–36.0)
MCV: 87.3 fL (ref 78.0–100.0)
RBC: 5.05 MIL/uL (ref 3.87–5.11)
RDW: 12.8 % (ref 11.5–15.5)
RDW: 12.6 % (ref 11.5–15.5)
WBC: 16.1 10*3/uL — ABNORMAL HIGH (ref 4.0–10.5)

## 2011-07-29 LAB — PRO B NATRIURETIC PEPTIDE: Pro B Natriuretic peptide (BNP): 5042 pg/mL — ABNORMAL HIGH (ref 0–125)

## 2011-07-29 LAB — TSH: TSH: 0.589 u[IU]/mL (ref 0.350–4.500)

## 2011-07-29 MED ORDER — DOXYCYCLINE HYCLATE 100 MG PO TABS
100.0000 mg | ORAL_TABLET | Freq: Two times a day (BID) | ORAL | Status: DC
  Administered 2011-07-29 – 2011-08-02 (×8): 100 mg via ORAL
  Filled 2011-07-29 (×11): qty 1

## 2011-07-29 MED ORDER — METOPROLOL TARTRATE 12.5 MG HALF TABLET
12.5000 mg | ORAL_TABLET | Freq: Four times a day (QID) | ORAL | Status: DC
  Administered 2011-07-29 – 2011-08-02 (×16): 12.5 mg via ORAL
  Filled 2011-07-29 (×20): qty 1

## 2011-07-29 MED ORDER — POTASSIUM CHLORIDE CRYS ER 20 MEQ PO TBCR
40.0000 meq | EXTENDED_RELEASE_TABLET | Freq: Every day | ORAL | Status: DC
  Administered 2011-07-30 – 2011-08-02 (×4): 40 meq via ORAL
  Filled 2011-07-29 (×4): qty 2

## 2011-07-29 MED ORDER — ALUM & MAG HYDROXIDE-SIMETH 200-200-20 MG/5ML PO SUSP
15.0000 mL | ORAL | Status: DC | PRN
  Administered 2011-07-29 – 2011-08-01 (×3): 30 mL via ORAL
  Filled 2011-07-29 (×3): qty 30

## 2011-07-29 NOTE — Progress Notes (Signed)
Subjective: Patinet had episode of signif SOB last night when got to floor.  Does get SOB now walking to commode. NOtes slight heaviness on chest.  NOt like last night.  Notes intermittent pain in back between shoulder blades.  Better if she sits up.   Objective: Filed Vitals:   07/28/11 2147 07/28/11 2218 07/29/11 0540 07/29/11 0731  BP: 151/99  118/80   Pulse: 96  91   Temp: 97.6 F (36.4 C)  98.6 F (37 C)   TempSrc: Oral  Oral   Resp: 21  19   Height:      Weight:      SpO2: 96% 94% 96% 94%   Weight change:   Intake/Output Summary (Last 24 hours) at 07/29/11 1610 Last data filed at 07/29/11 0700  Gross per 24 hour  Intake    880 ml  Output    600 ml  Net    280 ml    General: Alert, awake, oriented x3, in no acute distress.  Neck;  No JVD LUngs  Relatively clear after cough. Cardiac:  RRR  S1, S2,  No murmurs Ext:  No edema  Lab Results: Results for orders placed during the hospital encounter of 07/28/11 (from the past 24 hour(s))  CBC     Status: Abnormal   Collection Time   07/28/11 12:38 PM      Component Value Range   WBC 14.1 (*) 4.0 - 10.5 (K/uL)   RBC 4.78  3.87 - 5.11 (MIL/uL)   Hemoglobin 14.7  12.0 - 15.0 (g/dL)   HCT 96.0  45.4 - 09.8 (%)   MCV 86.0  78.0 - 100.0 (fL)   MCH 30.8  26.0 - 34.0 (pg)   MCHC 35.8  30.0 - 36.0 (g/dL)   RDW 11.9  14.7 - 82.9 (%)   Platelets 205  150 - 400 (K/uL)  BASIC METABOLIC PANEL     Status: Abnormal   Collection Time   07/28/11 12:38 PM      Component Value Range   Sodium 141  135 - 145 (mEq/L)   Potassium 3.2 (*) 3.5 - 5.1 (mEq/L)   Chloride 103  96 - 112 (mEq/L)   CO2 24  19 - 32 (mEq/L)   Glucose, Bld 117 (*) 70 - 99 (mg/dL)   BUN 10  6 - 23 (mg/dL)   Creatinine, Ser 5.62  0.50 - 1.10 (mg/dL)   Calcium 9.3  8.4 - 13.0 (mg/dL)   GFR calc non Af Amer >90  >90 (mL/min)   GFR calc Af Amer >90  >90 (mL/min)  TROPONIN I     Status: Normal   Collection Time   07/28/11 12:38 PM      Component Value Range   Troponin I <0.30  <0.30 (ng/mL)  PROTIME-INR     Status: Normal   Collection Time   07/28/11  8:30 PM      Component Value Range   Prothrombin Time 13.4  11.6 - 15.2 (seconds)   INR 1.00  0.00 - 1.49   APTT     Status: Normal   Collection Time   07/28/11  8:30 PM      Component Value Range   aPTT 30  24 - 37 (seconds)  CBC     Status: Abnormal   Collection Time   07/28/11  8:30 PM      Component Value Range   WBC 17.0 (*) 4.0 - 10.5 (K/uL)   RBC 5.11  3.87 - 5.11 (  MIL/uL)   Hemoglobin 15.7 (*) 12.0 - 15.0 (g/dL)   HCT 21.3  08.6 - 57.8 (%)   MCV 86.5  78.0 - 100.0 (fL)   MCH 30.7  26.0 - 34.0 (pg)   MCHC 35.5  30.0 - 36.0 (g/dL)   RDW 46.9  62.9 - 52.8 (%)   Platelets 243  150 - 400 (K/uL)  DIFFERENTIAL     Status: Abnormal   Collection Time   07/28/11  8:30 PM      Component Value Range   Neutrophils Relative 76  43 - 77 (%)   Neutro Abs 12.9 (*) 1.7 - 7.7 (K/uL)   Lymphocytes Relative 18  12 - 46 (%)   Lymphs Abs 3.0  0.7 - 4.0 (K/uL)   Monocytes Relative 6  3 - 12 (%)   Monocytes Absolute 1.0  0.1 - 1.0 (K/uL)   Eosinophils Relative 0  0 - 5 (%)   Eosinophils Absolute 0.0  0.0 - 0.7 (K/uL)   Basophils Relative 0  0 - 1 (%)   Basophils Absolute 0.0  0.0 - 0.1 (K/uL)  COMPREHENSIVE METABOLIC PANEL     Status: Abnormal   Collection Time   07/28/11  8:30 PM      Component Value Range   Sodium 138  135 - 145 (mEq/L)   Potassium 3.7  3.5 - 5.1 (mEq/L)   Chloride 100  96 - 112 (mEq/L)   CO2 25  19 - 32 (mEq/L)   Glucose, Bld 198 (*) 70 - 99 (mg/dL)   BUN 9  6 - 23 (mg/dL)   Creatinine, Ser 4.13  0.50 - 1.10 (mg/dL)   Calcium 9.5  8.4 - 24.4 (mg/dL)   Total Protein 7.6  6.0 - 8.3 (g/dL)   Albumin 3.9  3.5 - 5.2 (g/dL)   AST 23  0 - 37 (U/L)   ALT 21  0 - 35 (U/L)   Alkaline Phosphatase 115  39 - 117 (U/L)   Total Bilirubin 0.6  0.3 - 1.2 (mg/dL)   GFR calc non Af Amer >90  >90 (mL/min)   GFR calc Af Amer >90  >90 (mL/min)  CARDIAC PANEL(CRET KIN+CKTOT+MB+TROPI)      Status: Abnormal   Collection Time   07/28/11  8:55 PM      Component Value Range   Total CK 114  7 - 177 (U/L)   CK, MB 12.4 (*) 0.3 - 4.0 (ng/mL)   Troponin I 1.67 (*) <0.30 (ng/mL)   Relative Index 10.9 (*) 0.0 - 2.5   CARDIAC PANEL(CRET KIN+CKTOT+MB+TROPI)     Status: Abnormal   Collection Time   07/28/11 11:24 PM      Component Value Range   Total CK 115  7 - 177 (U/L)   CK, MB 12.4 (*) 0.3 - 4.0 (ng/mL)   Troponin I 1.64 (*) <0.30 (ng/mL)   Relative Index 10.8 (*) 0.0 - 2.5   HEPARIN LEVEL (UNFRACTIONATED)     Status: Normal   Collection Time   07/29/11  4:18 AM      Component Value Range   Heparin Unfractionated 0.37  0.30 - 0.70 (IU/mL)  CBC     Status: Abnormal   Collection Time   07/29/11  4:18 AM      Component Value Range   WBC 16.1 (*) 4.0 - 10.5 (K/uL)   RBC 5.05  3.87 - 5.11 (MIL/uL)   Hemoglobin 15.3 (*) 12.0 - 15.0 (g/dL)   HCT 01.0  27.2 -  46.0 (%)   MCV 87.3  78.0 - 100.0 (fL)   MCH 30.3  26.0 - 34.0 (pg)   MCHC 34.7  30.0 - 36.0 (g/dL)   RDW 16.1  09.6 - 04.5 (%)   Platelets 235  150 - 400 (K/uL)  BASIC METABOLIC PANEL     Status: Abnormal   Collection Time   07/29/11  4:19 AM      Component Value Range   Sodium 136  135 - 145 (mEq/L)   Potassium 4.1  3.5 - 5.1 (mEq/L)   Chloride 96  96 - 112 (mEq/L)   CO2 28  19 - 32 (mEq/L)   Glucose, Bld 124 (*) 70 - 99 (mg/dL)   BUN 8  6 - 23 (mg/dL)   Creatinine, Ser 4.09  0.50 - 1.10 (mg/dL)   Calcium 9.6  8.4 - 81.1 (mg/dL)   GFR calc non Af Amer 88 (*) >90 (mL/min)   GFR calc Af Amer >90  >90 (mL/min)  CARDIAC PANEL(CRET KIN+CKTOT+MB+TROPI)     Status: Abnormal   Collection Time   07/29/11  4:30 AM      Component Value Range   Total CK 114  7 - 177 (U/L)   CK, MB 12.5 (*) 0.3 - 4.0 (ng/mL)   Troponin I 1.57 (*) <0.30 (ng/mL)   Relative Index 11.0 (*) 0.0 - 2.5     Studies/Results: Dg Chest 2 View  07/28/2011  *RADIOLOGY REPORT*  Clinical Data: Chest pain  CHEST - 2 VIEW  Comparison: 09/18/2010   Findings: Hyperinflation noted with diffuse interstitial prominence compatible with background COPD/emphysema.  Normal heart size and vascularity.  No definite focal pneumonia, collapse, consolidation, effusion or pneumothorax.  Trachea midline.  Degenerative thoracic spine.  IMPRESSION: Stable chronic changes and COPD/emphysema.  Original Report Authenticated By: Judie Petit. Ruel Favors, M.D.    Medications: I have reviewed the patient's current medications.   Patient Active Hospital Problem List: Chest pain ()   Assessment: Patient's symptoms got worse initially when got to hospital  Eased.  Trop and MB are positive.  EKG without definite changes.   Plan: Continue heparin and ASA.  Check BNP and d dimer  Plan cath to define anatomy tomorrow, sooner is symptoms worsen. Will add very low dose b blocker and follow response. COPD (09/18/2010)   Assessment: Recent URI which may be stressing above.   Plan: Continue ABX. Bronchitis, acute, with bronchospasm () HTN:  Follow  Add low dose b blocker. Dyslipidemia:  Continue Zocor     Assessment:  As above.   Plan:    LOS: 1 day   Dietrich Pates 07/29/2011, 9:27 AM

## 2011-07-29 NOTE — Progress Notes (Signed)
ANTICOAGULATION CONSULT NOTE - Follow Up Consult  Pharmacy Consult for Heparin Indication: Chest pain  Allergies  Allergen Reactions  . Amitriptyline Hcl     REACTION: rash  . Codeine     REACTION: nausea, itching  . Hydrocodone Itching  . Indomethacin     REACTION: extreme pain  . Propoxyphene N-Acetaminophen     REACTION: itching    Patient Measurements: Height: 5\' 9"  (175.3 cm) Weight: 236 lb 5.3 oz (107.2 kg) IBW/kg (Calculated) : 66.2  He[arin Dosing weight: 90 kg  Vital Signs: Temp: 98.6 F (37 C) (12/02 0540) Temp src: Oral (12/02 0540) BP: 118/80 mmHg (12/02 0540) Pulse Rate: 91  (12/02 0540)  Labs:  Basename 07/29/11 0430 07/29/11 0419 07/29/11 0418 07/28/11 2324 07/28/11 2055 07/28/11 2030 07/28/11 1238  HGB -- -- 15.3* -- -- 15.7* --  HCT -- -- 44.1 -- -- 44.2 41.1  PLT -- -- 235 -- -- 243 205  APTT -- -- -- -- -- 30 --  LABPROT -- -- -- -- -- 13.4 --  INR -- -- -- -- -- 1.00 --  HEPARINUNFRC -- -- 0.37 -- -- -- --  CREATININE -- 0.75 -- -- -- 0.68 0.67  CKTOTAL 114 -- -- 115 114 -- --  CKMB 12.5* -- -- 12.4* 12.4* -- --  TROPONINI 1.57* -- -- 1.64* 1.67* -- --   Estimated Creatinine Clearance: 92.6 ml/min (by C-G formula based on Cr of 0.75).  Goal of Therapy:  Heparin level 0.3-0.7 units/ml  Assessment and Plan: 65 yo F on heparin for chest pain.  Initial heparin level is therapeutic.  Will recheck level in 6 hours to confirm.  Continue heparin at  1200 units/hr.    Carolyn Faulkner, Judie Bonus 07/29/2011,7:10 AM

## 2011-07-29 NOTE — Progress Notes (Signed)
ANTICOAGULATION CONSULT NOTE - Follow Up Consult  Pharmacy Consult for Heparin Indication: chest pain/ACS/NSTEMI  Allergies  Allergen Reactions  . Amitriptyline Hcl     REACTION: rash  . Codeine     REACTION: nausea, itching  . Hydrocodone Itching  . Indomethacin     REACTION: extreme pain  . Propoxyphene N-Acetaminophen     REACTION: itching    Patient Measurements: Height: 5\' 9"  (175.3 cm) Weight: 236 lb 5.3 oz (107.2 kg) IBW/kg (Calculated) : 66.2   Vital Signs: Temp: 98.6 F (37 C) (12/02 0540) Temp src: Oral (12/02 0540) BP: 118/80 mmHg (12/02 0540) Pulse Rate: 91  (12/02 0540)  Labs:  Alvira Philips 07/29/11 0945 07/29/11 0430 07/29/11 0419 07/29/11 0418 07/28/11 2324 07/28/11 2055 07/28/11 2030  HGB 14.9 -- -- 15.3* -- -- --  HCT 42.9 -- -- 44.1 -- -- 44.2  PLT 206 -- -- 235 -- -- 243  APTT -- -- -- -- -- -- 30  LABPROT -- -- -- -- -- -- 13.4  INR -- -- -- -- -- -- 1.00  HEPARINUNFRC 0.33 -- -- 0.37 -- -- --  CREATININE 0.71 -- 0.75 -- -- -- 0.68  CKTOTAL -- 114 -- -- 115 114 --  CKMB -- 12.5* -- -- 12.4* 12.4* --  TROPONINI -- 1.57* -- -- 1.64* 1.67* --   Estimated Creatinine Clearance: 92.6 ml/min (by C-G formula based on Cr of 0.71).   Medications:  Scheduled:    . aspirin  324 mg Oral NOW   Or  . aspirin  300 mg Rectal NOW  . aspirin EC  81 mg Oral Daily  . doxycycline  100 mg Oral Daily  . heparin  4,000 Units Intravenous Once  . hydrochlorothiazide  25 mg Oral Daily  . levothyroxine  125 mcg Oral Daily  . losartan  100 mg Oral Daily  . metoprolol tartrate  12.5 mg Oral QID  .  morphine injection  4 mg Intravenous Once  . potassium chloride  40 mEq Oral Daily  . simvastatin  40 mg Oral q1800  . tiotropium  18 mcg Inhalation Daily  . DISCONTD: losartan-hydrochlorothiazide  1 tablet Oral Daily  . DISCONTD: potassium chloride  20 mEq Oral BID    Assessment: 65 y/o female patient admitted with chest pain, NSTEMI receiving heparin for  anticoagulation. Heparin level therapeutic, but on lower side of goal, will increase gtt rate. No bleeding reported.  Goal of Therapy:  Heparin level 0.3-0.7 units/ml   Plan:  Increase heparin to 1300 units/hr and f/u in am. Plan for cath tomorrow.  Verlene Mayer, PharmD, BCPS Pager [phone]  07/29/2011,11:03 AM

## 2011-07-30 ENCOUNTER — Encounter (HOSPITAL_COMMUNITY)
Admission: EM | Disposition: A | Discharge status: Home / Self Care | Payer: Self-pay | Source: Home / Self Care | Attending: Internal Medicine

## 2011-07-30 ENCOUNTER — Encounter (HOSPITAL_COMMUNITY): Payer: Self-pay | Admitting: Cardiology

## 2011-07-30 DIAGNOSIS — I214 Non-ST elevation (NSTEMI) myocardial infarction: Secondary | ICD-10-CM

## 2011-07-30 HISTORY — PX: LEFT HEART CATHETERIZATION WITH CORONARY ANGIOGRAM: SHX5451

## 2011-07-30 LAB — CBC
HCT: 38.7 % (ref 36.0–46.0)
Hemoglobin: 13.3 g/dL (ref 12.0–15.0)
MCH: 30.1 pg (ref 26.0–34.0)
MCHC: 34.4 g/dL (ref 30.0–36.0)
RDW: 12.9 % (ref 11.5–15.5)

## 2011-07-30 LAB — HEPARIN LEVEL (UNFRACTIONATED): Heparin Unfractionated: 0.44 IU/mL (ref 0.30–0.70)

## 2011-07-30 LAB — POCT ACTIVATED CLOTTING TIME: Activated Clotting Time: 122 seconds

## 2011-07-30 SURGERY — LEFT HEART CATHETERIZATION WITH CORONARY ANGIOGRAM
Anesthesia: LOCAL

## 2011-07-30 MED ORDER — ASPIRIN 81 MG PO CHEW: 324.0000 mg | CHEWABLE_TABLET | ORAL | Status: DC

## 2011-07-30 MED ORDER — MIDAZOLAM HCL 2 MG/2ML IJ SOLN
INTRAMUSCULAR | Status: AC
  Filled 2011-07-30: qty 2

## 2011-07-30 MED ORDER — NITROGLYCERIN 0.2 MG/ML ON CALL CATH LAB
INTRAVENOUS | Status: AC
  Filled 2011-07-30: qty 1

## 2011-07-30 MED ORDER — OXYCODONE-ACETAMINOPHEN 5-325 MG PO TABS: 1.0000 | ORAL_TABLET | ORAL | Status: DC | PRN

## 2011-07-30 MED ORDER — SODIUM CHLORIDE 0.9 % IV SOLN
INTRAVENOUS | Status: AC
  Administered 2011-07-30: 10:00:00 via INTRAVENOUS

## 2011-07-30 MED ORDER — ASPIRIN 81 MG PO CHEW
324.0000 mg | CHEWABLE_TABLET | ORAL | Status: AC
  Administered 2011-07-30: 324 mg via ORAL
  Filled 2011-07-30: qty 4

## 2011-07-30 MED ORDER — LIDOCAINE HCL (PF) 1 % IJ SOLN
INTRAMUSCULAR | Status: AC
  Filled 2011-07-30: qty 30

## 2011-07-30 MED ORDER — SODIUM CHLORIDE 0.9 % IV SOLN
1.0000 mL/kg/h | INTRAVENOUS | Status: DC
  Administered 2011-07-30: 1 mL/kg/h via INTRAVENOUS

## 2011-07-30 MED ORDER — HEPARIN (PORCINE) IN NACL 2-0.9 UNIT/ML-% IJ SOLN
INTRAMUSCULAR | Status: AC
  Filled 2011-07-30: qty 2000

## 2011-07-30 MED ORDER — FENTANYL CITRATE 0.05 MG/ML IJ SOLN
INTRAMUSCULAR | Status: AC
  Filled 2011-07-30: qty 2

## 2011-07-30 MED ORDER — SODIUM CHLORIDE 0.9 % IV SOLN: 1.0000 mL/kg/h | INTRAVENOUS | Status: DC

## 2011-07-30 NOTE — Op Note (Signed)
Cardiac Catheterization Procedure Note  Name: Carolyn Faulkner MRN: [id number] DOB: 08-May-1946  Procedure: Left Heart Cath, Selective Coronary Angiography, LV angiography  Indication: NonSTEMI   Procedural details: The right groin was prepped, draped, and anesthetized with 1% lidocaine. Using modified Seldinger technique, a 4 French sheath was introduced into the right femoral artery. Standard Judkins catheters were used for coronary angiography and left ventriculography. Catheter exchanges were performed over a guidewire. There were no immediate procedural complications. The patient was transferred to the post catheterization recovery area for further monitoring.  Procedural Findings: Hemodynamics:  AO 112/63(83) LV 108/15   Coronary angiography: Coronary dominance: right  Left mainstem: The left main stem was a large caliber vessel that was free of significant disease  Left anterior descending (LAD): The left anterior descending artery coursed to the cardiac apex and had one major diagonal branch. This vessel appeared to be free of significant disease. In the midportion of the left anterior descending artery was a small branch that appeared to provide flow to the proximal pulmonary artery. 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  Left circumflex (LCx): The left circumflex artery provided an AV circumflex and marginal branch which is moderately large and somewhat tortuous and smaller subbranch. Other than minor luminal irregularities, the vessel demonstrated no high-grade obstruction.  Right coronary artery (RCA): The right coronary artery was a fairly large caliber vessel it provides a posterior descending branch which bifurcated proximally and a smaller branch and somewhat larger branch and large posterolateral branch. Both of these were free of critical disease.  Left ventriculography: Left ventricular systolic function is normal, LVEF is estimated at  55-65%.  There does appear to be some mitral regurgitation there was catheter-induced, but only he did not appear to be significant.  Final Conclusions:    1. No significant high-grade focal obstruction in the coronary vessels. 2. Well-preserved left ventricular function with catheter-induced mitral regurgitation, not thought to be clinically significant. 3. Elevated cardiac enzymes of uncertain etiology.  Recommendations:   1. D-dimer will be obtained and possibly a CT angiogram chest. 2. Repeat electrocardiography. 3. Discontinuation of heparin at the present time   Shawnie Pons 07/30/2011, 9:35 AM

## 2011-07-30 NOTE — Progress Notes (Signed)
Subjective:  Patient seen in follow up and precath.  Data reviewed. Chest pain occurred consistent with ACS.  Enzymes are pos.  She also has bronchitis, but symptoms are improved.     Objective:  Vital Signs in the last 24 hours: Temp:  [98 F (36.7 C)-98.3 F (36.8 C)] 98.3 F (36.8 C) (12/03 0500) Pulse Rate:  [65-78] 65  (12/03 0500) Resp:  [20] 20  (12/03 0500) BP: (110)/(70-74) 110/70 mmHg (12/03 0500) SpO2:  [95 %-96 %] 96 % (12/03 0500) Weight:  [105.5 kg (232 lb 9.4 oz)] 232 lb 9.4 oz (105.5 kg) (12/03 0500)  Intake/Output from previous day: 12/02 0701 - 12/03 0700 In: 360 [P.O.:360] Out: -    Physical Exam: General: Well developed, well nourished, in no acute distress. Head:  Normocephalic and atraumatic. Lungs: Clear to auscultation and percussion.  Ronchii in bases with deep inspiration Heart: Normal S1 and S2.  No murmur, rubs or gallops.  Pulses: Pulses normal in all 4 extremities. Extremities: No clubbing or cyanosis. No edema. Good right radial pulse Neurologic: Alert and oriented x 3.    Lab Results:  [facility] 07/30/11 0645 07/29/11 0945  WBC 10.9* 15.2*  HGB 13.3 14.9  PLT 189 206    Basename 07/29/11 0945 07/29/11 0419  NA 137 136  K 3.9 4.1  CL 101 96  CO2 27 28  GLUCOSE 142* 124*  BUN 8 8  CREATININE 0.71 0.75    Basename 07/29/11 1353 07/29/11 0430  TROPONINI 0.88* 1.57*   Hepatic Function Panel  Basename 07/28/11 2030  PROT 7.6  ALBUMIN 3.9  AST 23  ALT 21  ALKPHOS 115  BILITOT 0.6  BILIDIR --  IBILI --   No results found for this basename: CHOL in the last 72 hours No results found for this basename: PROTIME in the last 72 hours  Imaging: Dg Chest 2 View  07/28/2011  *RADIOLOGY REPORT*  Clinical Data: Chest pain  CHEST - 2 VIEW  Comparison: 09/18/2010  Findings: Hyperinflation noted with diffuse interstitial prominence compatible with background COPD/emphysema.  Normal heart size and vascularity.  No definite focal pneumonia,  collapse, consolidation, effusion or pneumothorax.  Trachea midline.  Degenerative thoracic spine.  IMPRESSION: Stable chronic changes and COPD/emphysema.  Original Report Authenticated By: Judie Petit. Ruel Favors, M.D.    EKG:  Old anterior MI vs. Delay in R wave progression due to lead location.      Assessment/Plan:  Patient Active Hospital Problem List: Chest pain ()   Assessment: Pain resolved   Plan: Findings consistent with non STEMI.  Risk, benefits, alternatives of cardiac cath reviewed with patient.  She understands and consents to proceed.    Bronchitis, acute, with bronchospasm ()   Assessment: Symptoms improved   Plan: Continue pulmonary toilet.       Shawnie Pons, MD, [facility], FSCAI 07/30/2011, 7:34 AM

## 2011-07-30 NOTE — Progress Notes (Signed)
Stable post cath. D-dimer not significantly elevated.  Not quite sure of source of symptoms.   Repeat ECG in am, and enzymes.    Shawnie Pons 07/30/2011 7:42 PM

## 2011-07-31 ENCOUNTER — Other Ambulatory Visit: Payer: Self-pay

## 2011-07-31 ENCOUNTER — Inpatient Hospital Stay (HOSPITAL_COMMUNITY): Payer: 59

## 2011-07-31 DIAGNOSIS — I214 Non-ST elevation (NSTEMI) myocardial infarction: Secondary | ICD-10-CM

## 2011-07-31 LAB — CBC
HCT: 38.5 % (ref 36.0–46.0)
Hemoglobin: 13 g/dL (ref 12.0–15.0)
MCHC: 33.8 g/dL (ref 30.0–36.0)
MCV: 88.1 fL (ref 78.0–100.0)
RDW: 12.9 % (ref 11.5–15.5)

## 2011-07-31 LAB — BASIC METABOLIC PANEL
BUN: 9 mg/dL (ref 6–23)
Chloride: 104 mEq/L (ref 96–112)
Creatinine, Ser: 0.81 mg/dL (ref 0.50–1.10)
GFR calc non Af Amer: 75 mL/min — ABNORMAL LOW (ref >90)
Glucose, Bld: 100 mg/dL — ABNORMAL HIGH (ref 70–99)
Potassium: 4.1 mEq/L (ref 3.5–5.1)

## 2011-07-31 MED ORDER — IOHEXOL 300 MG/ML  SOLN
100.0000 mL | Freq: Once | INTRAMUSCULAR | Status: AC | PRN
  Administered 2011-07-31: 100 mL via INTRAVENOUS

## 2011-07-31 MED ORDER — SODIUM CHLORIDE 0.9 % IV SOLN
INTRAVENOUS | Status: AC
  Administered 2011-07-31: 12:00:00 via INTRAVENOUS

## 2011-07-31 NOTE — Progress Notes (Signed)
Subjective:  Not really too short of breath.  Feels ok.  No chest pain.  The day of the event, her son and daughter got into a fight, and had to be separated.  This happened about thirty minutes later.    Objective:  Vital Signs in the last 24 hours: Temp:  [97.8 F (36.6 C)-98.4 F (36.9 C)] 97.8 F (36.6 C) (12/04 0500) Pulse Rate:  [67-71] 71  (12/04 0500) Resp:  [18-20] 20  (12/04 0500) BP: (104-112)/(55-73) 110/71 mmHg (12/04 0500) SpO2:  [92 %-95 %] 94 % (12/04 0700)  Intake/Output from previous day: 12/03 0701 - 12/04 0700 In: 1080 [P.O.:1080] Out: 600 [Urine:600]   Physical Exam: General: Well developed, well nourished, in no acute distress. Head:  Normocephalic and atraumatic. Lungs: Clear to auscultation and percussion. Heart: Normal S1 and S2.  No murmur, rubs or gallops.  Pulses: Pulses normal in all 4 extremities. Extremities: No clubbing or cyanosis. No edema. Neurologic: Alert and oriented x 3.    Lab Results:  Basename 07/31/11 0700 07/30/11 0645  WBC 9.1 10.9*  HGB 13.0 13.3  PLT 180 189    Basename 07/31/11 0700 07/29/11 0945  NA 141 137  K 4.1 3.9  CL 104 101  CO2 28 27  GLUCOSE 100* 142*  BUN 9 8  CREATININE 0.81 0.71    Basename 07/29/11 1353 07/29/11 0430  TROPONINI 0.88* 1.57*   Hepatic Function Panel  Basename 07/28/11 2030  PROT 7.6  ALBUMIN 3.9  AST 23  ALT 21  ALKPHOS 115  BILITOT 0.6  BILIDIR --  IBILI --   No results found for this basename: CHOL in the last 72 hours No results found for this basename: PROTIME in the last 72 hours  Imaging: No results found.  EKG:  Inferolateral T wave abnormality, new from prior tracing.  Cardiac Studies:  D-dimer minimally elevated.  CT angio and 2D echo pending.  Angios reviewed again in detail.   Assessment/Plan:  Patient Active Hospital Problem List: Non-STEMI   Assessment: Enzymes pos up and down, new ECG changes   Plan: Ambulate, check CT to rule out PE, and 2D echo for  better assessment of LV.  Review of films---?mild anterolateral HK  ---  ??? Takotsubo.   Hyperlipidemia ()   Assessment: treat as appropriate   Plan: low threshold for treatment Hypertension ()   Assessment: Controlled   Plan: Observe COPD (09/18/2010)   Assessment: stable.  Slight wheezes on exam   Plan: Continue to monitor       Shawnie Pons, MD, [facility], [facility] 07/31/2011, 8:24 AM

## 2011-08-01 ENCOUNTER — Other Ambulatory Visit: Payer: Self-pay

## 2011-08-01 DIAGNOSIS — I369 Nonrheumatic tricuspid valve disorder, unspecified: Secondary | ICD-10-CM

## 2011-08-01 LAB — BASIC METABOLIC PANEL
BUN: 13 mg/dL (ref 6–23)
CO2: 25 mEq/L (ref 19–32)
Calcium: 9.5 mg/dL (ref 8.4–10.5)
Chloride: 104 mEq/L (ref 96–112)
Creatinine, Ser: 0.79 mg/dL (ref 0.50–1.10)

## 2011-08-01 MED ORDER — FUROSEMIDE 10 MG/ML IJ SOLN
20.0000 mg | Freq: Once | INTRAMUSCULAR | Status: AC
  Administered 2011-08-01: 20 mg via INTRAVENOUS
  Filled 2011-08-01: qty 2

## 2011-08-01 NOTE — Progress Notes (Signed)
Subjective:  Doing well.  CT neg for PE. Small nodule that will need follow up. She was a smoker.    Objective:  Vital Signs in the last 24 hours: Temp:  [97.5 F (36.4 C)-97.8 F (36.6 C)] 97.7 F (36.5 C) (12/05 0600) Pulse Rate:  [60-90] 65  (12/05 0600) Resp:  [18-20] 20  (12/05 0600) BP: (109-130)/(69-85) 130/85 mmHg (12/05 0600) SpO2:  [94 %-96 %] 96 % (12/05 0736)  Intake/Output from previous day: 12/04 0701 - 12/05 0700 In: 720 [P.O.:720] Out: -    Physical Exam: General: Well developed, well nourished, in no acute distress. Head:  Normocephalic and atraumatic. Lungs: Clear to auscultation and percussion. Heart: Normal S1 and S2.  No murmur, rubs or gallops.  Pulses: Pulses normal in all 4 extremities. Extremities: No clubbing or cyanosis. No edema. Neurologic: Alert and oriented x 3.    Lab Results:  Basename 07/31/11 0700 07/30/11 0645  WBC 9.1 10.9*  HGB 13.0 13.3  PLT 180 189    Basename 08/01/11 0630 07/31/11 0700  NA 138 141  K 3.9 4.1  CL 104 104  CO2 25 28  GLUCOSE 105* 100*  BUN 13 9  CREATININE 0.79 0.81    Basename 07/29/11 1353  TROPONINI 0.88*   Hepatic Function Panel No results found for this basename: PROT,ALBUMIN,AST,ALT,ALKPHOS,BILITOT,BILIDIR,IBILI in the last 72 hours No results found for this basename: CHOL in the last 72 hours No results found for this basename: PROTIME in the last 72 hours  Imaging: Ct Angio Chest W/cm &/or Wo Cm  07/31/2011  *RADIOLOGY REPORT*  Clinical Data:  Elevated D-dimer, chest pain, shortness of breath, question pulmonary embolism  CT ANGIOGRAPHY CHEST WITH CONTRAST  Technique:  Multidetector CT imaging of the chest was performed using the standard protocol during bolus administration of intravenous contrast.  Multiplanar CT image reconstructions including MIPs were obtained to evaluate the vascular anatomy.  Contrast: OMNIPAQUE IOHEXOL 300 MG/ML IV SOLN  Comparison:  None  Findings: Aorta normal  caliber with minimal atherosclerotic calcification. No gross evidence of aortic aneurysm or dissection. Well opacified pulmonary arterial circulation. Scattered streak artifacts from dense contrast within the SVC. Pulmonary arteries patent. No evidence of pulmonary embolism. Scattered mediastinal and right hilar adenopathy, including a 1.6 x 1.5 cm right hilar lymph nodes image 130, and azygo esophageal recess node 2.0 x 1.6 cm image 154, and right paratracheal / periaortic nodes.  Question minimally nodular hepatic contours. Probable small splenule. Small bilateral pleural effusions. Peribronchial thickening with dependent atelectasis in both lower lobes. Questionable 4 mm nodule right middle lobe image 57. No definite infiltrate or pneumothorax. No acute osseous findings.  Review of the MIP images confirms the above findings.  IMPRESSION: No evidence of pulmonary embolism. Small bibasilar pleural effusions and atelectasis. Bronchitic changes with questionable 4 mm right middle lobe nodule, recommendation below. Nonspecific mediastinal and right hilar adenopathy. Hepatic margins are questionably slightly nodular, cannot exclude cirrhosis.  If the patient is at high risk for bronchogenic carcinoma, follow- up chest CT at 1 year is recommended.  If the patient is at low risk, no follow-up is needed.  This recommendation follows the consensus statement: Guidelines for Management of Small Pulmonary Nodules Detected on CT Scans:  A Statement from the Fleischner Society as published in Radiology 2005; 237:395-400.  Available online at:  DietDisorder.cz.  Original Report Authenticated By: Lollie Marrow, M.D.    EKG:  Yesterday demonstrates TWI anterolaterally consistent with findings of non STEMI  Cardiac  Studies:  Echo pending still.  To  Be done today.  Assessment/Plan:  Patient Active Hospital Problem List:   Hypertension ()   Assessment: BP are under control.   Plan:  reduce HCTZ to 12.5mg  per day, and K to .  Follow up BMET in office.    Bronchitis, acute, with bronchospasm ()   Assessment: Seems improved   Plan: Continue current treatment Non-STEMI (non-ST elevated myocardial infarction) (07/31/2011)   Assessment: Seems most likely takotsubo.  Will plan medical therapy.  S   Plan: She will need follow up by early next week.  Will await echo and decide on dc today.         Shawnie Pons, MD, [facility], FSCAI 08/01/2011, 9:02 AM

## 2011-08-01 NOTE — Progress Notes (Signed)
Patient ID: Carolyn Faulkner, female   DOB: 06/15/1946, 65 y.o.   MRN: [id number]   Patient's echo called to patient.  She has elevated LVEDP, and my sense is that the findings are consistent with Takotsubo.  She is agreeable to staying until tomorrow, and we will stop her HCTZ and give her IV furosemide.  She will be sent home on PO furosemide.    Shawnie Pons 08/01/2011 4:20 PM

## 2011-08-01 NOTE — Progress Notes (Signed)
Echocardiogram 2D Echocardiogram has been performed.  Carolyn Faulkner 08/01/2011, 2:52 PM

## 2011-08-02 DIAGNOSIS — R911 Solitary pulmonary nodule: Secondary | ICD-10-CM

## 2011-08-02 DIAGNOSIS — I214 Non-ST elevation (NSTEMI) myocardial infarction: Principal | ICD-10-CM

## 2011-08-02 DIAGNOSIS — R079 Chest pain, unspecified: Secondary | ICD-10-CM

## 2011-08-02 LAB — BASIC METABOLIC PANEL
GFR calc Af Amer: 78 mL/min — ABNORMAL LOW (ref >90)
GFR calc non Af Amer: 67 mL/min — ABNORMAL LOW (ref >90)
Glucose, Bld: 98 mg/dL (ref 70–99)
Potassium: 4 mEq/L (ref 3.5–5.1)
Sodium: 140 mEq/L (ref 135–145)

## 2011-08-02 MED ORDER — HYDROCHLOROTHIAZIDE 12.5 MG PO CAPS: 12.5000 mg | ORAL_CAPSULE | Freq: Every day | ORAL | Status: DC

## 2011-08-02 MED ORDER — LOSARTAN POTASSIUM 100 MG PO TABS: 100.0000 mg | ORAL_TABLET | Freq: Every day | ORAL | Status: DC

## 2011-08-02 MED ORDER — DOXYCYCLINE HYCLATE 100 MG PO TABS: 100.0000 mg | ORAL_TABLET | Freq: Two times a day (BID) | ORAL | Status: AC

## 2011-08-02 MED ORDER — ASPIRIN 81 MG PO TBEC: 81.0000 mg | DELAYED_RELEASE_TABLET | Freq: Every day | ORAL | Status: AC

## 2011-08-02 MED ORDER — METOPROLOL SUCCINATE ER 25 MG PO TB24: 25.0000 mg | ORAL_TABLET | Freq: Every day | ORAL | Status: DC

## 2011-08-02 MED ORDER — FUROSEMIDE 20 MG PO TABS: 20.0000 mg | ORAL_TABLET | Freq: Two times a day (BID) | ORAL | Status: DC

## 2011-08-02 NOTE — Discharge Summary (Signed)
Physician Discharge Summary  Patient ID: Carolyn Faulkner MRN: [id number] DOB/AGE: 05-25-1946 65 y.o.  Admit date: 07/28/2011 Discharge date: 08/02/2011  Primary Discharge Diagnosis: NSTEMI  Secondary Discharge Diagnosis:  Patient Active Problem List  Diagnoses  . Hypothyroidism  . Hyperlipidemia  . Hypokalemia  . Hypertension  . COPD  . Postinflammatory pulmonary fibrosis  . Chest pain  . Obesity (BMI 30-39.9)  . Lumbar disc disease  . Bronchitis, acute, with bronchospasm  . Lung nodule   Significant Diagnostic Studies:Left Heart Cath, Selective Coronary Angiography, LV angiography, CT ANGIOGRAPHY CHEST WITH CONTRAST,CHEST - 2 VIEW, Transthoracic Echocardiography  Hospital Course: Carolyn Faulkner is a 65 year old female with no previous history of coronary artery disease. She has a remote history of a normal cardiac catheterization in 2004. After an episode of emotional stress, she developed chest pain that radiated to her left shoulder. She was brought to the emergency room. She continued to complain of some intermittent chest discomfort. She was admitted for further evaluation and treatment.  She was placed on doxycycline for possible bronchitis. Her cardiac enzymes increased indicating a non-ST segment elevation MI. She was treated with aspirin, heparin, a beta blocker and a statin. She was taken to the cath lab on 07/30/2011. Final Conclusions:  1. No significant high-grade focal obstruction in the coronary vessels.  2. Well-preserved left ventricular function with catheter-induced mitral regurgitation, not thought to be clinically significant.  3. Elevated cardiac enzymes of uncertain etiology.  Dr. Riley Kill reviewed the cath results and check a d-dimer. She had a CT angiogram of the chest performed. IMPRESSION: No evidence of pulmonary embolism. Small bibasilar pleural effusions and atelectasis. Bronchitic changes with questionable 4 mm right middle lobe nodule, recommendation below.  Nonspecific mediastinal and right hilar adenopathy. Hepatic margins are questionably slightly nodular, cannot exclude cirrhosis. If the patient is at high risk for bronchogenic carcinoma, follow- up chest CT at 1 year is recommended. If the patient is at low risk, no follow-up is needed.   Dr. Riley Kill reviewed the CT report and noted that she had T-wave inversions on her ECG. He felt this was consistent with a non-ST segment elevation MI. He also felt that this was most likely a takotsubo variant. A 2-D echocardiogram was performed with the results listed below.  . Study Conclusions - Left ventricle: Apparent ischemic cardiomyopathy. Tissue doppler measurements of E/E' medial and lateral > 18 consistent with LAP> 20 mmHg. However this finding has higher area under the ROC with EF< 40. LVEF in this situation is borderline to estimate LAP, however mitral inflow does suggest diastolic dysfunction. The cavity size was normal. Wall thickness was normal. Systolic function was mildly to moderately reduced. The estimated ejection fraction was in the range of 40% to 45%. Abnormal relaxation with probably increased filling pressures. - Regional wall motion abnormality: Hypokinesis of the mid anterior, mid anteroseptal, basal-mid inferoseptal, and apical septal myocardium; mild hypokinesis of the basal and apical anterior, apical inferior, and apical myocardium.  On 08/02/2011 Carolyn Faulkner was seen by Dr. Riley Kill. He adjusted her medications. She was ambulating without chest pain or shortness of breath and considered stable for discharge, to followup as an outpatient.   Labs:  Lab Results  Component Value Date   WBC 9.1 07/31/2011   HGB 13.0 07/31/2011   HCT 38.5 07/31/2011   MCV 88.1 07/31/2011   PLT 180 07/31/2011    Lab 08/02/11 0533 07/28/11 2030  NA 140 --  K 4.0 --  CL 102 --  CO2 29 --  BUN 16 --  CREATININE 0.89 --  CALCIUM 9.5 --  PROT -- 7.6  BILITOT -- 0.6  ALKPHOS -- 115  ALT --  21  AST -- 23  GLUCOSE 98 --   Lab Results  Component Value Date   CKTOTAL 84 07/29/2011   CKMB 8.1* 07/29/2011   TROPONINI 0.88* 07/29/2011    No results found for this basename: CHOL   No results found for this basename: HDL   No results found for this basename: LDLCALC   No results found for this basename: TRIG   No results found for this basename: CHOLHDL   No results found for this basename: LDLDIRECT     FOLLOW UP PLANS AND APPOINTMENTS Discharge Orders    Future Orders Please Complete By Expires   Diet - low sodium heart healthy      Increase activity slowly      Call MD for:  redness, tenderness, or signs of infection (pain, swelling, redness, odor or green/yellow discharge around incision site)      (HEART FAILURE PATIENTS) Call MD:  Anytime you have any of the following symptoms: 1) 3 pound weight gain in 24 hours or 5 pounds in 1 week 2) shortness of breath, with or without a dry hacking cough 3) swelling in the hands, feet or stomach 4) if you have to sleep on extra pillows at night in order to breathe.        Current Discharge Medication List    START taking these medications   Details  aspirin EC 81 MG EC tablet Take 1 tablet (81 mg total) by mouth daily.    metoprolol succinate (TOPROL XL) 25 MG 24 hr tablet Take 1 tablet (25 mg total) by mouth daily. Qty: 30 tablet, Refills: 11      CONTINUE these medications which have NOT CHANGED   Details  acetaminophen (TYLENOL) 325 MG tablet Take 325 mg by mouth every 6 (six) hours as needed. pain     albuterol (PROVENTIL HFA;VENTOLIN HFA) 108 (90 BASE) MCG/ACT inhaler Inhale 2 puffs into the lungs every 6 (six) hours as needed. For wheezing     levothyroxine (SYNTHROID, LEVOTHROID) 125 MCG tablet Take 125 mcg by mouth daily.      Lasix 20 mg daily  Take 1 tablet by mouth daily.      oxyCODONE-acetaminophen (PERCOCET) 5-325 MG per tablet Take 1 tablet by mouth every 4 (four) hours as needed. pain     potassium  chloride (KLOR-CON) 10 MEQ CR tablet Take 20 mEq by mouth daily.      pravastatin (PRAVACHOL) 40 MG tablet Take 80 mg by mouth at bedtime.      tiotropium (SPIRIVA) 18 MCG inhalation capsule Place 18 mcg into inhaler and inhale daily.      zolpidem (AMBIEN) 10 MG tablet Take 5 mg by mouth at bedtime as needed. For insomnia     Cozaar 100 mg daily    Follow-up Information repeat CT scan of the chest in one year    Follow up with HAMRICK,MAURA L .    Tereso Newcomer [facility] for Dr. Dietrich Pates on December 27 at 9:30   Bmet on Monday, December 10 at 12:45 PM     BRING ALL MEDICATIONS WITH YOU TO FOLLOW UP APPOINTMENTS  Time spent with patient to include physician time: Signed: Theodore Demark 08/02/2011, 12:40 PM Co-Sign MD

## 2011-08-02 NOTE — Progress Notes (Signed)
[id number] Cardiac Rehab Pt ambulating in hall independently, denies any cp or SOB. Completed MI education with pt and daughter. Pt voices understanding. She agrees to Outpt CRP in [facility], will send referral.

## 2011-08-02 NOTE — Progress Notes (Signed)
Subjective:  Feels better after lasix and feels good. Wants to go home.  Need for follow up CT at one year discussed.    Objective:  Vital Signs in the last 24 hours: Temp:  [97.5 F (36.4 C)-97.6 F (36.4 C)] 97.5 F (36.4 C) (12/06 0500) Pulse Rate:  [57-62] 61  (12/06 0929) Resp:  [20] 20  (12/06 0500) BP: (104-118)/(67-70) 106/68 mmHg (12/06 0929) SpO2:  [95 %-97 %] 96 % (12/06 0500) Weight:  [105.4 kg (232 lb 5.8 oz)] 232 lb 5.8 oz (105.4 kg) (12/06 0500)  Intake/Output from previous day: 12/05 0701 - 12/06 0700 In: 680 [P.O.:680] Out: -    Physical Exam: General: Well developed, well nourished, in no acute distress. Head:  Normocephalic and atraumatic. Lungs: Clear to auscultation and percussion. Heart: Normal S1 and S2.  No murmur, rubs or gallops.  Pulses: Pulses normal in all 4 extremities. Extremities: No clubbing or cyanosis. No edema. Neurologic: Alert and oriented x 3.    Lab Results:  Basename 07/31/11 0700  WBC 9.1  HGB 13.0  PLT 180    Basename 08/02/11 0533 08/01/11 0630  NA 140 138  K 4.0 3.9  CL 102 104  CO2 29 25  GLUCOSE 98 105*  BUN 16 13  CREATININE 0.89 0.79   No results found for this basename: TROPONINI:2,CK,MB:2 in the last 72 hours Hepatic Function Panel No results found for this basename: PROT,ALBUMIN,AST,ALT,ALKPHOS,BILITOT,BILIDIR,IBILI in the last 72 hours No results found for this basename: CHOL in the last 72 hours No results found for this basename: PROTIME in the last 72 hours  Imaging: No results found.      Assessment/Plan:  Bronchitis, acute, with bronchospasm ()   Assessment: symptoms improved   Plan: Follow up in office Non-STEMI (non-ST elevated myocardial infarction) (07/31/2011)   Assessment: Probable Takotsubo mechanism.  Discussed at length   Plan: Early follow up in clinic.  Lung nodule (08/02/2011)   Assessment: See CT scan   Plan: Needs follow up 6-12 months.  See report LV dysfunction   See echo  report   Add furosemide 20mg  daily   Kdur   Needs BMET on Monday at [facility]   Follow up office visit one week either with Tereso Newcomer, or Dietrich Pates  Follow up with Dr. Tenny Craw. Greater than thirty minutes of discharge time.           Shawnie Pons, MD, [facility], FSCAI 08/02/2011, 12:11 PM

## 2011-08-06 ENCOUNTER — Other Ambulatory Visit: Payer: 59 | Admitting: *Deleted

## 2011-08-07 ENCOUNTER — Ambulatory Visit (INDEPENDENT_AMBULATORY_CARE_PROVIDER_SITE_OTHER): Payer: 59 | Admitting: *Deleted

## 2011-08-07 DIAGNOSIS — I10 Essential (primary) hypertension: Secondary | ICD-10-CM

## 2011-08-07 LAB — BASIC METABOLIC PANEL
Chloride: 103 mEq/L (ref 96–112)
Potassium: 3.7 mEq/L (ref 3.5–5.1)

## 2011-08-08 NOTE — ED Provider Notes (Signed)
Medical screening examination/treatment/procedure(s) were conducted as a shared visit with non-physician practitioner(s) and myself.  I personally evaluated the patient during the encounter.  65yf with CP. Abnormal ekg. PA discussed with cards who will eval in ED.  Raeford Razor, MD 08/08/11 [phone]

## 2011-08-14 ENCOUNTER — Telehealth: Payer: Self-pay | Admitting: Internal Medicine

## 2011-08-14 NOTE — Telephone Encounter (Signed)
New problem:  Per Minerva Areola, need a sign referral before patient begin cardiac rehab. Blank form will be faxing over.

## 2011-08-14 NOTE — Telephone Encounter (Signed)
Minerva Areola from [facility] called needing form signed before patient can attend cardiac rehab.Fowarded to [facility] PA

## 2011-08-15 ENCOUNTER — Telehealth: Payer: Self-pay | Admitting: Cardiology

## 2011-08-15 NOTE — Telephone Encounter (Signed)
I spoke with the pt and an order will be faxed to Dr Hamrick's office for repeat BMP this week. The pt complains of left shoulder numbness and arm tingling when she raises her arm.  The pt denies CP or any other symptoms.  The pt does have a history of bursitis and has had cortisone injections in this shoulder in the past.  I made the pt aware that this was most likely nerve related and she will continue to monitor her symptoms.

## 2011-08-15 NOTE — Telephone Encounter (Signed)
New message:  Pt called with fax number to Dr. Nathanial Rancher to order the labs:  [phone].  She also wants to ask about tingling in the arm.  Please call and discuss with her.

## 2011-08-16 ENCOUNTER — Encounter: Payer: Self-pay | Admitting: Internal Medicine

## 2011-08-23 ENCOUNTER — Encounter: Payer: Self-pay | Admitting: *Deleted

## 2011-08-23 ENCOUNTER — Ambulatory Visit (INDEPENDENT_AMBULATORY_CARE_PROVIDER_SITE_OTHER): Payer: 59 | Admitting: Physician Assistant

## 2011-08-23 ENCOUNTER — Encounter: Payer: Self-pay | Admitting: Physician Assistant

## 2011-08-23 VITALS — BP 132/60 | HR 48 | Ht 69.0 in | Wt 232.0 lb

## 2011-08-23 DIAGNOSIS — J984 Other disorders of lung: Secondary | ICD-10-CM

## 2011-08-23 DIAGNOSIS — I214 Non-ST elevation (NSTEMI) myocardial infarction: Secondary | ICD-10-CM

## 2011-08-23 DIAGNOSIS — I5181 Takotsubo syndrome: Secondary | ICD-10-CM | POA: Insufficient documentation

## 2011-08-23 MED ORDER — METOPROLOL SUCCINATE ER 25 MG PO TB24: 12.5000 mg | ORAL_TABLET | Freq: Every day | ORAL | Status: DC

## 2011-08-23 NOTE — Assessment & Plan Note (Signed)
Controlled.  

## 2011-08-23 NOTE — Assessment & Plan Note (Signed)
Managed by PCP

## 2011-08-23 NOTE — Patient Instructions (Signed)
Your physician has recommended you make the following change in your medication: DECREASE METOPROLOL TO 1/2 TABLET EVERY DAY.    YOUR PHYSICIAN RECOMMENDS you have a Chest CT in 1 YEAR:  Non-Cardiac CT scanning, (CAT scanning), is a noninvasive, special x-ray that produces cross-sectional images of the body using x-rays and a computer. CT scans help physicians diagnose and treat medical conditions. For some CT exams, a contrast material is used to enhance visibility in the area of the body being studied. CT scans provide greater clarity and reveal more details than regular x-ray exams.    Your physician recommends that you schedule a follow-up appointment in: 6-8 WEEKS WITH DR. Tenny Craw.

## 2011-08-23 NOTE — Progress Notes (Signed)
[address] [place], [place]  [address] Phone: [phone] Fax:  [phone]  Date:  08/23/2011   Name:  Carolyn Faulkner       DOB:  1945/10/14 MRN:  [id number]  PCP:  Dr. Nathanial Rancher Primary Cardiologist:  Dr. Dietrich Pates  Primary Electrophysiologist:  None    History of Present Illness: Carolyn Faulkner is a 65 y.o. female who presents for post hospital follow up.  She was admitted 12/1-12/6 with an NSTEMI.  She presented after an episode of emotional stress resulting in chest pain radiating to her left shoulder.  Troponin increased to 1.67. LHC 07/30/11: EF 55-65% and no obstructive CAD noted.  D-dimer was abnormal.  Chest CT demonstrated no evidence of pulmonary embolism.  There was evidence of small bibasilar pleural effusions and atelectasis, bronchitic changes with questionable 4 mm right middle lobe nodule with recommendations for followup chest CT in one year if patient at risk for bronchogenic carcinoma.  2-D echocardiogram demonstrated EF 40-45%, abnormal relaxation with probable increased filling pressures and hypokinesis of the mid anterior, mid anteroseptal, basal-mid inferoseptal and apical septal myocardium with mild hypokinesis of the basal and apical anterior, apical inferior and apical myocardium.  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  Medical therapy was continued.  Hospital labs: Hemoglobin 13, potassium 4, creatinine 0.9, ALT 21 pro BNP 5042, TSH 0.589.  Follow up labs (12/12):  K 3.7 => 4.1; creatinine 1.0 => 0.79  Since discharge, she has felt well.  She has noted some fatigue and very occasional lightheadedness.  Otherwise, the patient denies chest pain, shortness of breath, syncope, orthopnea, PND or significant pedal edema.   Past Medical History  Diagnosis Date  . COPD (chronic obstructive pulmonary disease)   . Hypertension   . Hyperlipidemia   . Obesity (BMI 30-39.9)   . COPD 09/18/2010  . Hypothyroidism   . NSTEMI (non-ST  elevated myocardial infarction)     LHC 07/30/11: EF 55-65% and no obstructive CAD noted  . Takotsubo cardiomyopathy     echo 12/12: EF 40-45%, abnormal relaxation with probable increased filling pressures and hypokinesis of the mid anterior, mid anteroseptal, basal-mid inferoseptal and apical septal myocardium with mild hypokinesis of the basal and apical anterior, apical inferior and apical myocardium.  . Lung nodule     4 mm RML nodule by CT 12/12 - needs repeat in 07/2012    Current Outpatient Prescriptions  Medication Sig Dispense Refill  . acetaminophen (TYLENOL) 325 MG tablet Take 325 mg by mouth every 6 (six) hours as needed. pain       . albuterol (PROVENTIL HFA;VENTOLIN HFA) 108 (90 BASE) MCG/ACT inhaler Inhale 2 puffs into the lungs every 6 (six) hours as needed. For wheezing       . aspirin EC 81 MG EC tablet Take 1 tablet (81 mg total) by mouth daily.      . furosemide (LASIX) 20 MG tablet Take 1 tablet (20 mg total) by mouth 2 (two) times daily.  30 tablet  11  . levothyroxine (SYNTHROID, LEVOTHROID) 125 MCG tablet Take 125 mcg by mouth daily.        Marland Kitchen losartan (COZAAR) 100 MG tablet Take 1 tablet (100 mg total) by mouth daily.  30 tablet  11  . metoprolol succinate (TOPROL XL) 25 MG 24 hr tablet Take 1 tablet (25 mg total) by mouth daily.  30 tablet  11  . oxyCODONE-acetaminophen (PERCOCET) 5-325 MG per tablet Take 1 tablet by mouth  every 4 (four) hours as needed. pain       . potassium chloride (KLOR-CON) 10 MEQ CR tablet Take 20 mEq by mouth daily.        . pravastatin (PRAVACHOL) 40 MG tablet Take 80 mg by mouth at bedtime.        Marland Kitchen tiotropium (SPIRIVA) 18 MCG inhalation capsule Place 18 mcg into inhaler and inhale daily.        Marland Kitchen zolpidem (AMBIEN) 10 MG tablet Take 5 mg by mouth at bedtime as needed. For insomnia         Allergies: Allergies  Allergen Reactions  . Amitriptyline Hcl     REACTION: rash  . Codeine     REACTION: nausea, itching  . Hydrocodone Itching  .  Indomethacin     REACTION: extreme pain  . Propoxyphene N-Acetaminophen     REACTION: itching    History  Substance Use Topics  . Smoking status: Former Smoker -- 2.5 packs/day for 30 years    Types: Cigarettes    Quit date: 09/27/2000  . Smokeless tobacco: Never Used  . Alcohol Use: No     ROS:  Please see the history of present illness.   She notes a nonproductive cough.  All other systems reviewed and negative.   PHYSICAL EXAM: VS:  BP 132/60  Pulse 48  Ht 5\' 9"  (1.753 m)  Wt 232 lb (105.235 kg)  BMI 34.26 kg/m2 Well nourished, well developed, in no acute distress HEENT: normal Neck: no JVD Cardiac:  normal S1, S2; RRR; no murmur Lungs:  clear to auscultation bilaterally, no wheezing, rhonchi or rales Abd: soft, nontender, no hepatomegaly Ext: no edema; RFA site without hematoma or bruits Skin: warm and dry Neuro:  CNs 2-12 intact, no focal abnormalities noted  EKG:   Sinus bradycardia, heart rate 48, normal axis, T wave inversions in 2, 3, aVF, V2-V6  ASSESSMENT AND PLAN:

## 2011-08-23 NOTE — Assessment & Plan Note (Signed)
She is an ex-smoker.  Followup chest CT in one year will be arranged.

## 2011-08-23 NOTE — Assessment & Plan Note (Signed)
Overall, doing well.  No signs or symptoms of volume overload.  Followup renal function and potassium has been stable.  She start cardiac rehabilitation soon at [facility].  She is bradycardic.  She may be slightly symptomatic from this.  I will decrease her metoprolol to one half pill daily.  If she continues to feel lightheaded with this, we will need to discontinue.  Followup with Dr. Tenny Craw in 6-8 weeks.

## 2011-08-28 HISTORY — PX: ELBOW SURGERY: SHX618

## 2011-09-05 ENCOUNTER — Telehealth: Payer: Self-pay | Admitting: Internal Medicine

## 2011-09-05 NOTE — Telephone Encounter (Signed)
Lonie Peak PA  Checked pt's BP yesterday and pulse was 44, told pt to stop metoprolol, and wanted dr Tenny Craw to know, if she doesn't want pt to stop please call pt

## 2011-09-07 ENCOUNTER — Encounter: Payer: Self-pay | Admitting: Internal Medicine

## 2011-09-07 NOTE — Telephone Encounter (Signed)
Called patient back. She states that she was dropped down to Metoprolol 12.5 mg every day for 2 weeks and her heart rate was still in the 40's. She is currently not taking any Metoprolol and heart rate is 50 to 54. Patient feels well and is currently in cardiac rehab. Will let Dr.Ross know.

## 2011-09-07 NOTE — Telephone Encounter (Signed)
I would like her to be on some metoprolol I would cut in 1/2 to 12.5 mg. Follow bp  If not dizzy or bp below 90 systolic continue.

## 2011-09-17 NOTE — Telephone Encounter (Signed)
OK 

## 2011-10-04 ENCOUNTER — Ambulatory Visit (INDEPENDENT_AMBULATORY_CARE_PROVIDER_SITE_OTHER): Payer: 59 | Admitting: Internal Medicine

## 2011-10-04 ENCOUNTER — Encounter: Payer: Self-pay | Admitting: Internal Medicine

## 2011-10-04 DIAGNOSIS — E785 Hyperlipidemia, unspecified: Secondary | ICD-10-CM

## 2011-10-04 DIAGNOSIS — I5181 Takotsubo syndrome: Secondary | ICD-10-CM

## 2011-10-04 DIAGNOSIS — R911 Solitary pulmonary nodule: Secondary | ICD-10-CM

## 2011-10-04 DIAGNOSIS — I10 Essential (primary) hypertension: Secondary | ICD-10-CM

## 2011-10-04 NOTE — Assessment & Plan Note (Signed)
Keep on same regimen. 

## 2011-10-04 NOTE — Patient Instructions (Signed)
Your physician wants you to follow-up in: October 2013 You will receive a reminder letter in the mail two months in advance. If you don't receive a letter, please call our office to schedule the follow-up appointment.  

## 2011-10-04 NOTE — Assessment & Plan Note (Signed)
Will need f/u CT.

## 2011-10-04 NOTE — Assessment & Plan Note (Signed)
LDL is a little higher than I'd like.  She is now exercising more and eatting less.  I would follow.  Keep on same regimen for now.  Recheck in fall.

## 2011-10-04 NOTE — Assessment & Plan Note (Signed)
Patient is recovering well.  I would continue to follow.  Stay active.
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
## 2011-12-05 ENCOUNTER — Encounter: Payer: Self-pay | Admitting: Internal Medicine

## 2011-12-06 ENCOUNTER — Encounter: Payer: Self-pay | Admitting: Internal Medicine

## 2011-12-18 ENCOUNTER — Encounter: Payer: Self-pay | Admitting: Internal Medicine

## 2011-12-21 ENCOUNTER — Telehealth: Payer: Self-pay | Admitting: *Deleted

## 2011-12-21 MED ORDER — ATORVASTATIN CALCIUM 20 MG PO TABS: ORAL_TABLET | ORAL | Status: DC

## 2011-12-21 NOTE — Telephone Encounter (Signed)
Called patient with lab review from [facility] blood work. Advised to stop Pravachol and start Lipitor 20mg  every evening and have repeat labs in 2 months per Dr.Ross. Patient aware of above and will have repeat lab work at [facility] and will forward to [facility].

## 2012-02-13 ENCOUNTER — Other Ambulatory Visit: Payer: Self-pay | Admitting: *Deleted

## 2012-02-13 MED ORDER — ATORVASTATIN CALCIUM 20 MG PO TABS: ORAL_TABLET | ORAL | Status: DC

## 2012-03-13 ENCOUNTER — Encounter: Payer: Self-pay | Admitting: Internal Medicine

## 2012-03-19 ENCOUNTER — Other Ambulatory Visit: Payer: Self-pay | Admitting: *Deleted

## 2012-03-19 MED ORDER — EZETIMIBE 10 MG PO TABS: 10.0000 mg | ORAL_TABLET | Freq: Every day | ORAL | Status: DC

## 2012-05-14 ENCOUNTER — Other Ambulatory Visit: Payer: Self-pay | Admitting: *Deleted

## 2012-05-14 MED ORDER — FUROSEMIDE 20 MG PO TABS: 20.0000 mg | ORAL_TABLET | Freq: Two times a day (BID) | ORAL | Status: DC

## 2012-06-27 ENCOUNTER — Other Ambulatory Visit (HOSPITAL_COMMUNITY): Payer: Self-pay | Admitting: Physician Assistant

## 2012-07-21 ENCOUNTER — Telehealth: Payer: Self-pay | Admitting: *Deleted

## 2012-07-21 NOTE — Telephone Encounter (Signed)
Called patient and left message on home AND cell phone to Stop Pravachol and start Lipitor 20mg  every day per Dr.Ross. Order sent to [facility] in [place] [facility] with 6 refills. Only has a $17.00 copay per month for this. Needs fasting labs at [facility] in 2 to 3 months and then sent to Dr.Ross.

## 2012-08-18 ENCOUNTER — Encounter: Payer: Self-pay | Admitting: Internal Medicine

## 2012-08-18 ENCOUNTER — Ambulatory Visit (AMBULATORY_SURGERY_CENTER): Payer: 59 | Admitting: *Deleted

## 2012-08-18 ENCOUNTER — Telehealth: Payer: Self-pay | Admitting: *Deleted

## 2012-08-18 VITALS — Ht 69.25 in | Wt 234.0 lb

## 2012-08-18 DIAGNOSIS — Z1211 Encounter for screening for malignant neoplasm of colon: Secondary | ICD-10-CM

## 2012-08-18 MED ORDER — SUPREP BOWEL PREP KIT 17.5-3.13-1.6 GM/177ML PO SOLN: ORAL | Status: DC

## 2012-08-18 NOTE — Telephone Encounter (Signed)
Dr. Leone Payor, would you review Ms Hemrick' chart to see if she is appropriate to have her colon at [facility]?  Her Echo 07/29/11 showed EF of 40-45%. She states that was the only one she ever had.  She is scheduled for a colon 09/05/12.  Thank you.   Wyona Almas

## 2012-08-18 NOTE — Progress Notes (Signed)
Dr. Gessner, would you review Ms Risenhoover' chart to see if she is appropriate to have her colon at [facility]?  Her Echo 07/29/11 showed EF of 40-45%. She states that was the only one she ever had.  She is scheduled for a colon 09/05/12.  Thank you.   JoAnn Cherice Glennie   

## 2012-08-19 NOTE — Telephone Encounter (Signed)
Spoke with patient and informed her it was [facility], per Dr Leone Payor to have her colonoscopy done at [facility] on 09/05/12. No changes necessary. Pt understood.

## 2012-08-19 NOTE — Telephone Encounter (Signed)
She is ok for [facility]

## 2012-08-25 ENCOUNTER — Telehealth: Payer: Self-pay | Admitting: Internal Medicine

## 2012-08-25 NOTE — Telephone Encounter (Signed)
Free suprep given to receptionist on 3rd floor.  Pt. Instructed to pick her free prep up from 3rd floor front desk.

## 2012-09-05 ENCOUNTER — Encounter: Payer: Self-pay | Admitting: Internal Medicine

## 2012-09-05 ENCOUNTER — Ambulatory Visit (AMBULATORY_SURGERY_CENTER): Payer: Medicare Other | Admitting: Internal Medicine

## 2012-09-05 VITALS — BP 160/71 | HR 48 | Temp 98.1°F | Resp 20 | Ht 69.0 in | Wt 234.0 lb

## 2012-09-05 DIAGNOSIS — K5730 Diverticulosis of large intestine without perforation or abscess without bleeding: Secondary | ICD-10-CM

## 2012-09-05 DIAGNOSIS — K648 Other hemorrhoids: Secondary | ICD-10-CM

## 2012-09-05 DIAGNOSIS — Z1211 Encounter for screening for malignant neoplasm of colon: Secondary | ICD-10-CM

## 2012-09-05 MED ORDER — SODIUM CHLORIDE 0.9 % IV SOLN: 500.0000 mL | INTRAVENOUS | Status: DC

## 2012-09-05 NOTE — Op Note (Signed)
[facility] [address] [place], [address]   COLONOSCOPY PROCEDURE REPORT  PATIENT: Carolyn, Faulkner  MR#: [id number] BIRTHDATE: Jun 18, 1946 , 66  yrs. old GENDER: Female ENDOSCOPIST: Iva Boop, MD, [facility] REFERRED AO:ZHYQM Hamrick, M.D. PROCEDURE DATE:  09/05/2012 PROCEDURE:   Colonoscopy, screening ASA CLASS:   Class III INDICATIONS:average risk screening. MEDICATIONS: Propofol (Diprivan), Propofol (Diprivan) 260 mg IV, MAC sedation, administered by CRNA, and These medications were titrated to patient response per physician's verbal order  DESCRIPTION OF PROCEDURE:   After the risks benefits and alternatives of the procedure were thoroughly explained, informed consent was obtained.  A digital rectal exam revealed no abnormalities of the rectum.   The LB [id number]  endoscope was introduced through the anus and advanced to the cecum, which was identified by both the appendix and ileocecal valve. No adverse events experienced.   The quality of the prep was Suprep adequate The instrument was then slowly withdrawn as the colon was fully examined.      COLON FINDINGS: There was severe diverticulosis noted in the left colon with associated muscular hypertrophy and luminal narrowing. Moderate diverticulosis was noted The finding was in the right colon.   Moderate sized internal hemorrhoids were found.   The colon mucosa was otherwise normal.  Retroflexed views revealed internal hemorrhoids. The time to cecum=6 minutes 11 seconds. Withdrawal time=10 minutes 23 seconds.  The scope was withdrawn and the procedure completed. COMPLICATIONS: There were no complications.  ENDOSCOPIC IMPRESSION: 1.   There was severe diverticulosis noted in the left colon 2.   Moderate diverticulosis was noted in the right colon 3.   Moderate sized internal hemorrhoids 4.   The colon mucosa was otherwise normal  RECOMMENDATIONS: 1.  Repeat Colonscopy in 10 years. 2.   Talk  to Dr.  Nathanial Rancher about the lung nodule on the 2012 CT scan and scheduling a follow-up exam if she thinks necessary   eSigned:  Iva Boop, MD, [facility] 09/05/2012 2:49 PM cc: Burnell Blanks, MD and The Patient

## 2012-09-05 NOTE — Patient Instructions (Addendum)
No polyps or cancer were seen - good news! You do have diverticulosis and hemorrhoids - common conditions which usually do not cause significant problems.  I noted on chart review that you are due for a follow-up CT scan of the chest to make sure that a nodule (spot) on your lung is not growing as lung cancer can do. I recommend you talk to Dr. Nathanial Rancher about this. I am printing a copy of the report for you to take to her.  Thank you for choosing me and [facility].  Iva Boop, MD, FACG  YOU HAD AN ENDOSCOPIC PROCEDURE TODAY AT THE [facility]: Refer to the procedure report that was given to you for any specific questions about what was found during the examination.  If the procedure report does not answer your questions, please call your gastroenterologist to clarify.  If you requested that your care partner not be given the details of your procedure findings, then the procedure report has been included in a sealed envelope for you to review at your convenience later.  YOU SHOULD EXPECT: Some feelings of bloating in the abdomen. Passage of more gas than usual.  Walking can help get rid of the air that was put into your GI tract during the procedure and reduce the bloating. If you had a lower endoscopy (such as a colonoscopy or flexible sigmoidoscopy) you may notice spotting of blood in your stool or on the toilet paper. If you underwent a bowel prep for your procedure, then you may not have a normal bowel movement for a few days.  DIET: Your first meal following the procedure should be a light meal and then it is ok to progress to your normal diet.  A half-sandwich or bowl of soup is an example of a good first meal.  Heavy or fried foods are harder to digest and may make you feel nauseous or bloated.  Likewise meals heavy in dairy and vegetables can cause extra gas to form and this can also increase the bloating.  Drink plenty of fluids but you should avoid alcoholic  beverages for 24 hours.  ACTIVITY: Your care partner should take you home directly after the procedure.  You should plan to take it easy, moving slowly for the rest of the day.  You can resume normal activity the day after the procedure however you should NOT DRIVE or use heavy machinery for 24 hours (because of the sedation medicines used during the test).    SYMPTOMS TO REPORT IMMEDIATELY: A gastroenterologist can be reached at any hour.  During normal business hours, 8:30 AM to 5:00 PM Monday through Friday, call [phone].  After hours and on weekends, please call the GI answering service at [phone] who will take a message and have the physician on call contact you.   Following lower endoscopy (colonoscopy or flexible sigmoidoscopy):  Excessive amounts of blood in the stool  Significant tenderness or worsening of abdominal pains  Swelling of the abdomen that is new, acute  Fever of 100F or higher  FOLLOW UP: If any biopsies were taken you will be contacted by phone or by letter within the next 1-3 weeks.  Call your gastroenterologist if you have not heard about the biopsies in 3 weeks.  Our staff will call the home number listed on your records the next business day following your procedure to check on you and address any questions or concerns that you may have at that time regarding the  information given to you following your procedure. This is a courtesy call and so if there is no answer at the home number and we have not heard from you through the emergency physician on call, we will assume that you have returned to your regular daily activities without incident.  SIGNATURES/CONFIDENTIALITY: You and/or your care partner have signed paperwork which will be entered into your electronic medical record.  These signatures attest to the fact that that the information above on your After Visit Summary has been reviewed and is understood.  Full responsibility of the confidentiality of  this discharge information lies with you and/or your care-partner.   Diverticulosis-handout given  Hemorrhoids- see below  Follow-up with Primary care for CT results  Repeat colonoscopy in 10 years   Hemorrhoids Hemorrhoids are enlarged (dilated) veins around the rectum. There are 2 types of hemorrhoids, and the type of hemorrhoid is determined by its location. Internal hemorrhoids occur in the veins just inside the rectum.They are usually not painful, but they may bleed.However, they may poke through to the outside and become irritated and painful. External hemorrhoids involve the veins outside the anus and can be felt as a painful swelling or hard lump near the anus.They are often itchy and may crack and bleed. Sometimes clots will form in the veins. This makes them swollen and painful. These are called thrombosed hemorrhoids. CAUSES Causes of hemorrhoids include:  Pregnancy. This increases the pressure in the hemorrhoidal veins.  Constipation.  Straining to have a bowel movement.  Obesity.  Heavy lifting or other activity that caused you to strain. TREATMENT Most of the time hemorrhoids improve in 1 to 2 weeks. However, if symptoms do not seem to be getting better or if you have a lot of rectal bleeding, your caregiver may perform a procedure to help make the hemorrhoids get smaller or remove them completely.Possible treatments include:  Rubber band ligation. A rubber band is placed at the base of the hemorrhoid to cut off the circulation.  Sclerotherapy. A chemical is injected to shrink the hemorrhoid.  Infrared light therapy. Tools are used to burn the hemorrhoid.  Hemorrhoidectomy. This is surgical removal of the hemorrhoid. HOME CARE INSTRUCTIONS   Increase fiber in your diet. Ask your caregiver about using fiber supplements.  Drink enough water and fluids to keep your urine clear or pale yellow.  Exercise regularly.  Go to the bathroom when you have the urge to  have a bowel movement. Do not wait.  Avoid straining to have bowel movements.  Keep the anal area dry and clean.  Only take over-the-counter or prescription medicines for pain, discomfort, or fever as directed by your caregiver. If your hemorrhoids are thrombosed:  Take warm sitz baths for 20 to 30 minutes, 3 to 4 times per day.  If the hemorrhoids are very tender and swollen, place ice packs on the area as tolerated. Using ice packs between sitz baths may be helpful. Fill a plastic bag with ice. Place a towel between the bag of ice and your skin.  Medicated creams and suppositories may be used or applied as directed.  Do not use a donut-shaped pillow or sit on the toilet for long periods. This increases blood pooling and pain. SEEK MEDICAL CARE IF:   You have increasing pain and swelling that is not controlled with your medicine.  You have uncontrolled bleeding.  You have difficulty or you are unable to have a bowel movement.  You have pain or inflammation outside the  area of the hemorrhoids.  You have chills or an oral temperature above 102 F (38.9 C). MAKE SURE YOU:   Understand these instructions.  Will watch your condition.  Will get help right away if you are not doing well or get worse. Document Released: 08/10/2000 Document Revised: 11/05/2011 Document Reviewed: 07/24/2010 [facility] Patient Information 2013 [place], [place].
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
## 2012-09-05 NOTE — Progress Notes (Signed)
1445 a/ox3 pleased with MAC report to April RN

## 2012-09-08 ENCOUNTER — Telehealth: Payer: Self-pay | Admitting: *Deleted

## 2012-09-08 NOTE — Telephone Encounter (Signed)
  Follow up Call-  Call back number 09/05/2012  Post procedure Call Back phone  # [phone]  Permission to leave phone message Yes     Patient questions:  Do you have a fever, pain , or abdominal swelling? no Pain Score  0 *  Have you tolerated food without any problems? yes  Have you been able to return to your normal activities? yes  Do you have any questions about your discharge instructions: Diet   no Medications  no Follow up visit  no  Do you have questions or concerns about your Care? no  Actions: * If pain score is 4 or above: No action needed, pain <4.

## 2012-09-26 ENCOUNTER — Other Ambulatory Visit: Payer: Self-pay | Admitting: Physician Assistant

## 2012-09-26 ENCOUNTER — Encounter: Payer: Self-pay | Admitting: Internal Medicine

## 2012-09-26 DIAGNOSIS — R911 Solitary pulmonary nodule: Secondary | ICD-10-CM

## 2012-09-29 ENCOUNTER — Ambulatory Visit (INDEPENDENT_AMBULATORY_CARE_PROVIDER_SITE_OTHER): Payer: Medicare Other | Admitting: Internal Medicine

## 2012-09-29 ENCOUNTER — Encounter: Payer: Self-pay | Admitting: Internal Medicine

## 2012-09-29 VITALS — BP 170/75 | HR 55 | Ht 69.0 in | Wt 231.0 lb

## 2012-09-29 DIAGNOSIS — I428 Other cardiomyopathies: Secondary | ICD-10-CM

## 2012-09-29 DIAGNOSIS — I429 Cardiomyopathy, unspecified: Secondary | ICD-10-CM
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
## 2012-09-29 NOTE — Patient Instructions (Addendum)
Your physician wants you to follow-up in: 1 year with Dr. Ross.  You will receive a reminder letter in the mail two months in advance. If you don't receive a letter, please call our office to schedule the follow-up appointment.  

## 2012-09-30 ENCOUNTER — Ambulatory Visit
Admission: RE | Admit: 2012-09-30 | Discharge: 2012-09-30 | Disposition: A | Discharge status: Home / Self Care | Payer: Medicare Other | Source: Ambulatory Visit | Attending: Physician Assistant | Admitting: Physician Assistant

## 2012-09-30 DIAGNOSIS — R911 Solitary pulmonary nodule: Secondary | ICD-10-CM

## 2012-09-30 MED ORDER — IOHEXOL 300 MG/ML  SOLN
75.0000 mL | Freq: Once | INTRAMUSCULAR | Status: AC | PRN
  Administered 2012-09-30: 75 mL via INTRAVENOUS

## 2012-12-25 ENCOUNTER — Other Ambulatory Visit: Payer: Self-pay | Admitting: Neurosurgery

## 2012-12-25 DIAGNOSIS — M48061 Spinal stenosis, lumbar region without neurogenic claudication: Secondary | ICD-10-CM

## 2012-12-31 ENCOUNTER — Ambulatory Visit
Admission: RE | Admit: 2012-12-31 | Discharge: 2012-12-31 | Disposition: A | Discharge status: Home / Self Care | Payer: Medicare Other | Source: Ambulatory Visit | Attending: Neurosurgery | Admitting: Neurosurgery

## 2012-12-31 ENCOUNTER — Other Ambulatory Visit: Payer: Self-pay | Admitting: Neurosurgery

## 2012-12-31 DIAGNOSIS — M48061 Spinal stenosis, lumbar region without neurogenic claudication: Secondary | ICD-10-CM

## 2012-12-31 MED ORDER — GADOBENATE DIMEGLUMINE 529 MG/ML IV SOLN
20.0000 mL | Freq: Once | INTRAVENOUS | Status: AC | PRN
  Administered 2012-12-31: 20 mL via INTRAVENOUS

## 2013-06-12 ENCOUNTER — Encounter: Payer: Self-pay | Admitting: Cardiology

## 2013-06-29 ENCOUNTER — Encounter: Payer: Self-pay | Admitting: Internal Medicine

## 2013-06-29 ENCOUNTER — Ambulatory Visit (INDEPENDENT_AMBULATORY_CARE_PROVIDER_SITE_OTHER): Payer: Medicare Other | Admitting: Internal Medicine

## 2013-06-29 ENCOUNTER — Ambulatory Visit
Admission: RE | Admit: 2013-06-29 | Discharge: 2013-06-29 | Disposition: A | Discharge status: Home / Self Care | Payer: 59 | Source: Ambulatory Visit | Attending: Internal Medicine | Admitting: Internal Medicine

## 2013-06-29 VITALS — BP 152/76 | HR 56 | Ht 69.0 in | Wt 238.5 lb

## 2013-06-29 DIAGNOSIS — R0602 Shortness of breath: Secondary | ICD-10-CM

## 2013-06-29 DIAGNOSIS — E039 Hypothyroidism, unspecified: Secondary | ICD-10-CM

## 2013-06-29 LAB — CBC WITH DIFFERENTIAL/PLATELET
Basophils Relative: 0.4 % (ref 0.0–3.0)
Eosinophils Absolute: 0.1 10*3/uL (ref 0.0–0.7)
Hemoglobin: 13.6 g/dL (ref 12.0–15.0)
Lymphocytes Relative: 25.8 % (ref 12.0–46.0)
MCHC: 33.7 g/dL (ref 30.0–36.0)
MCV: 89.3 fl (ref 78.0–100.0)
Monocytes Absolute: 0.5 10*3/uL (ref 0.1–1.0)
Neutro Abs: 5.3 10*3/uL (ref 1.4–7.7)
RBC: 4.52 Mil/uL (ref 3.87–5.11)

## 2013-06-29 LAB — BASIC METABOLIC PANEL
BUN: 10 mg/dL (ref 6–23)
Chloride: 106 mEq/L (ref 96–112)
Creatinine, Ser: 0.8 mg/dL (ref 0.4–1.2)

## 2013-06-29 MED ORDER — FLUTICASONE PROPIONATE HFA 220 MCG/ACT IN AERO: 1.0000 | INHALATION_SPRAY | Freq: Two times a day (BID) | RESPIRATORY_TRACT | Status: DC

## 2013-06-29 MED ORDER — ALBUTEROL SULFATE HFA 108 (90 BASE) MCG/ACT IN AERS: INHALATION_SPRAY | RESPIRATORY_TRACT | Status: AC

## 2013-06-29 NOTE — Patient Instructions (Addendum)
Lab work today   Chest Xray today at [facility]   Take Albuterol Inhaler  1 puff  Four times a day   Take Flovent Inhaler  1 puff twice a day
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
## 2013-07-01 LAB — TSH: TSH: 1.75 u[IU]/mL (ref 0.35–5.50)

## 2013-07-20 ENCOUNTER — Other Ambulatory Visit (HOSPITAL_COMMUNITY): Payer: Self-pay | Admitting: Internal Medicine

## 2013-08-12 ENCOUNTER — Telehealth: Payer: Self-pay | Admitting: *Deleted

## 2013-08-12 NOTE — Telephone Encounter (Signed)
[facility] called today regarding did pt receive flu shot here in our office. I have found no documentation that we gave her a flu shot.  They will give her one today as she is in there office at this time Mylo Red RN

## 2014-02-05 ENCOUNTER — Other Ambulatory Visit: Payer: Self-pay | Admitting: Neurosurgery

## 2014-02-05 DIAGNOSIS — M48061 Spinal stenosis, lumbar region without neurogenic claudication: Secondary | ICD-10-CM

## 2014-02-09 ENCOUNTER — Ambulatory Visit
Admission: RE | Admit: 2014-02-09 | Discharge: 2014-02-09 | Disposition: A | Discharge status: Home / Self Care | Payer: Medicare Other | Source: Ambulatory Visit | Attending: Neurosurgery | Admitting: Neurosurgery

## 2014-02-09 DIAGNOSIS — M48061 Spinal stenosis, lumbar region without neurogenic claudication: Secondary | ICD-10-CM

## 2014-02-18 ENCOUNTER — Other Ambulatory Visit: Payer: Self-pay | Admitting: Neurosurgery

## 2014-02-22 ENCOUNTER — Encounter (HOSPITAL_COMMUNITY): Payer: Self-pay | Admitting: Pharmacy Technician

## 2014-02-25 ENCOUNTER — Encounter (HOSPITAL_COMMUNITY)
Admission: RE | Admit: 2014-02-25 | Discharge: 2014-02-25 | Disposition: A | Discharge status: Home / Self Care | Payer: Medicare Other | Source: Ambulatory Visit | Attending: Neurosurgery | Admitting: Neurosurgery

## 2014-02-25 ENCOUNTER — Encounter (HOSPITAL_COMMUNITY): Payer: Self-pay

## 2014-02-25 DIAGNOSIS — Z01812 Encounter for preprocedural laboratory examination: Secondary | ICD-10-CM | POA: Diagnosis present

## 2014-02-25 HISTORY — DX: Cardiac murmur, unspecified: R01.1

## 2014-02-25 HISTORY — DX: Gastro-esophageal reflux disease without esophagitis: K21.9

## 2014-02-25 LAB — CBC WITH DIFFERENTIAL/PLATELET
BASOS ABS: 0 10*3/uL (ref 0.0–0.1)
BASOS PCT: 0 % (ref 0–1)
EOS PCT: 1 % (ref 0–5)
Eosinophils Absolute: 0.1 10*3/uL (ref 0.0–0.7)
HCT: 40.9 % (ref 36.0–46.0)
Hemoglobin: 13.8 g/dL (ref 12.0–15.0)
Lymphocytes Relative: 27 % (ref 12–46)
Lymphs Abs: 2.7 10*3/uL (ref 0.7–4.0)
MCH: 31.4 pg (ref 26.0–34.0)
MCHC: 33.7 g/dL (ref 30.0–36.0)
MCV: 93.2 fL (ref 78.0–100.0)
Monocytes Absolute: 0.7 10*3/uL (ref 0.1–1.0)
Monocytes Relative: 7 % (ref 3–12)
NEUTROS ABS: 6.5 10*3/uL (ref 1.7–7.7)
Neutrophils Relative %: 65 % (ref 43–77)
PLATELETS: 244 10*3/uL (ref 150–400)
RBC: 4.39 MIL/uL (ref 3.87–5.11)
RDW: 13 % (ref 11.5–15.5)
WBC: 10 10*3/uL (ref 4.0–10.5)

## 2014-02-25 LAB — BASIC METABOLIC PANEL
ANION GAP: 15 (ref 5–15)
BUN: 9 mg/dL (ref 6–23)
CALCIUM: 10 mg/dL (ref 8.4–10.5)
CO2: 28 mEq/L (ref 19–32)
Chloride: 100 mEq/L (ref 96–112)
Creatinine, Ser: 0.77 mg/dL (ref 0.50–1.10)
GFR calc Af Amer: >90 mL/min (ref >90)
GFR, EST NON AFRICAN AMERICAN: 86 mL/min — AB (ref >90)
Glucose, Bld: 116 mg/dL — ABNORMAL HIGH (ref 70–99)
Potassium: 4.6 mEq/L (ref 3.7–5.3)
SODIUM: 143 meq/L (ref 137–147)

## 2014-02-25 LAB — SURGICAL PCR SCREEN
MRSA, PCR: NEGATIVE
Staphylococcus aureus: NEGATIVE

## 2014-02-25 NOTE — Pre-Procedure Instructions (Addendum)
Carolyn Faulkner  02/25/2014   Your procedure is scheduled on:  Friday, July 10th   Report to [facility] Admitting at 10:00 AM.   Call this number if you have problems the morning of surgery: [phone]   Remember:   Do not eat food or drink liquids after midnight Thursday.    Take these medicines the morning of surgery with A SIP OF WATER: amlodipine, synthroid; you may take your pain medicine at your discretion    Do not wear jewelry, make-up or nail polish.  Do not wear lotions, powders, or perfumes. You may NOT wear deodorant.  Do not shave underarms & legs 48 hours prior to surgery.    Do not bring valuables to the hospital.  [facility] is not responsible for any belongings or valuables.               Contacts, dentures or bridgework may not be worn into surgery.  Leave suitcase in the car. After surgery it may be brought to your room.  For patients admitted to the hospital, discharge time is determined by your treatment team.    Name and phone number of your driver: Bother   Special Instructions: Special Instructions: [place] - Preparing for Surgery  Before surgery, you can play an important role.  Because skin is not sterile, your skin needs to be as free of germs as possible.  You can reduce the number of germs on you skin by washing with CHG (chlorahexidine gluconate) soap before surgery.  CHG is an antiseptic cleaner which kills germs and bonds with the skin to continue killing germs even after washing.  Please DO NOT use if you have an allergy to CHG or antibacterial soaps.  If your skin becomes reddened/irritated stop using the CHG and inform your nurse when you arrive at Short Stay.  Do not shave (including legs and underarms) for at least 48 hours prior to the first CHG shower.  You may shave your face.  Please follow these instructions carefully:   1.  Shower with CHG Soap the night before surgery and the  morning of Surgery.  2.  If you choose to wash  your hair, wash your hair first as usual with your  normal shampoo.  3.  After you shampoo, rinse your hair and body thoroughly to remove the  Shampoo.  4.  Use CHG as you would any other liquid soap.  You can apply chg directly to the skin and wash gently with scrungie or a clean washcloth.  5.  Apply the CHG Soap to your body ONLY FROM THE NECK DOWN.    Do not use on open wounds or open sores.  Avoid contact with your eyes, ears, mouth and genitals (private parts).  Wash genitals (private parts)   with your normal soap.  6.  Wash thoroughly, paying special attention to the area where your surgery will be performed.  7.  Thoroughly rinse your body with warm water from the neck down.  8.  DO NOT shower/wash with your normal soap after using and rinsing off   the CHG Soap.  9.  Pat yourself dry with a clean towel.            10.  Wear clean pajamas.            11.  Place clean sheets on your bed the night of your first shower and do not sleep with pets.  Day of Surgery  Do  not apply any lotions/deodorants the morning of surgery.  Please wear clean clothes to the hospital/surgery center.   Please read over the following fact sheets that you were given: Pain Booklet, Coughing and Deep Breathing, MRSA Information and Surgical Site Infection Prevention

## 2014-02-25 NOTE — Progress Notes (Signed)
Pt. Last seen by cardiac- Dr. Tenny Craw, her note reflects " would follow".  Pt. Explains that she was having SOB at the time & she was Rx inhalers, she went home & used them for a couple of days & she felt they greatly improved her situation .
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
## 2014-03-04 MED ORDER — DEXAMETHASONE SODIUM PHOSPHATE 10 MG/ML IJ SOLN
10.0000 mg | INTRAMUSCULAR | Status: AC
  Administered 2014-03-05: 10 mg via INTRAVENOUS
  Filled 2014-03-04: qty 1

## 2014-03-04 MED ORDER — CEFAZOLIN SODIUM-DEXTROSE 2-3 GM-% IV SOLR
2.0000 g | INTRAVENOUS | Status: AC
  Administered 2014-03-05: 2 g via INTRAVENOUS
  Filled 2014-03-04: qty 50

## 2014-03-04 NOTE — Progress Notes (Signed)
Spoke with pt and informed her of surgical time change and to arrive at 1200 with all other instructions remaining the same.  States understanding.

## 2014-03-05 ENCOUNTER — Encounter (HOSPITAL_COMMUNITY)
Admission: RE | Disposition: A | Discharge status: Home / Self Care | Payer: Self-pay | Source: Ambulatory Visit | Attending: Neurosurgery

## 2014-03-05 ENCOUNTER — Inpatient Hospital Stay (HOSPITAL_COMMUNITY)
Admission: RE | Admit: 2014-03-05 | Discharge: 2014-03-06 | DRG: 497 | Disposition: A | Discharge status: Home / Self Care | Payer: Medicare Other | Source: Ambulatory Visit | Attending: Neurosurgery | Admitting: Neurosurgery

## 2014-03-05 ENCOUNTER — Encounter (HOSPITAL_COMMUNITY): Payer: Medicare Other | Admitting: Vascular Surgery

## 2014-03-05 ENCOUNTER — Ambulatory Visit (HOSPITAL_COMMUNITY): Payer: Medicare Other | Admitting: Anesthesiology

## 2014-03-05 ENCOUNTER — Encounter (HOSPITAL_COMMUNITY): Payer: Self-pay | Admitting: *Deleted

## 2014-03-05 DIAGNOSIS — J4489 Other specified chronic obstructive pulmonary disease: Secondary | ICD-10-CM | POA: Diagnosis present

## 2014-03-05 DIAGNOSIS — T8489XA Other specified complication of internal orthopedic prosthetic devices, implants and grafts, initial encounter: Principal | ICD-10-CM | POA: Diagnosis present

## 2014-03-05 DIAGNOSIS — I10 Essential (primary) hypertension: Secondary | ICD-10-CM | POA: Diagnosis present

## 2014-03-05 DIAGNOSIS — I252 Old myocardial infarction: Secondary | ICD-10-CM

## 2014-03-05 DIAGNOSIS — J449 Chronic obstructive pulmonary disease, unspecified: Secondary | ICD-10-CM | POA: Diagnosis present

## 2014-03-05 DIAGNOSIS — Z87891 Personal history of nicotine dependence: Secondary | ICD-10-CM

## 2014-03-05 DIAGNOSIS — Z981 Arthrodesis status: Secondary | ICD-10-CM

## 2014-03-05 DIAGNOSIS — T8484XA Pain due to internal orthopedic prosthetic devices, implants and grafts, initial encounter: Secondary | ICD-10-CM

## 2014-03-05 DIAGNOSIS — K219 Gastro-esophageal reflux disease without esophagitis: Secondary | ICD-10-CM | POA: Diagnosis present

## 2014-03-05 DIAGNOSIS — M069 Rheumatoid arthritis, unspecified: Secondary | ICD-10-CM | POA: Diagnosis present

## 2014-03-05 DIAGNOSIS — E785 Hyperlipidemia, unspecified: Secondary | ICD-10-CM | POA: Diagnosis present

## 2014-03-05 DIAGNOSIS — E039 Hypothyroidism, unspecified: Secondary | ICD-10-CM | POA: Diagnosis present

## 2014-03-05 DIAGNOSIS — Z79899 Other long term (current) drug therapy: Secondary | ICD-10-CM

## 2014-03-05 DIAGNOSIS — Z7982 Long term (current) use of aspirin: Secondary | ICD-10-CM

## 2014-03-05 DIAGNOSIS — Y831 Surgical operation with implant of artificial internal device as the cause of abnormal reaction of the patient, or of later complication, without mention of misadventure at the time of the procedure: Secondary | ICD-10-CM | POA: Diagnosis present

## 2014-03-05 HISTORY — PX: HARDWARE REMOVAL: SHX979

## 2014-03-05 LAB — HEPATIC FUNCTION PANEL
ALBUMIN: 4.1 g/dL (ref 3.5–5.2)
ALT: 20 U/L (ref 0–35)
AST: 17 U/L (ref 0–37)
Alkaline Phosphatase: 89 U/L (ref 39–117)
BILIRUBIN TOTAL: 0.7 mg/dL (ref 0.3–1.2)
Bilirubin, Direct: <0.2 mg/dL (ref 0.0–0.3)
TOTAL PROTEIN: 7.2 g/dL (ref 6.0–8.3)

## 2014-03-05 SURGERY — REMOVAL, HARDWARE
Anesthesia: General | Site: Back | Laterality: Left

## 2014-03-05 MED ORDER — ASPIRIN EC 81 MG PO TBEC
81.0000 mg | DELAYED_RELEASE_TABLET | Freq: Every day | ORAL | Status: DC
  Filled 2014-03-05: qty 1

## 2014-03-05 MED ORDER — KETOROLAC TROMETHAMINE 30 MG/ML IJ SOLN
INTRAMUSCULAR | Status: AC
  Filled 2014-03-05: qty 1

## 2014-03-05 MED ORDER — OXYCODONE HCL 5 MG PO TABS
5.0000 mg | ORAL_TABLET | Freq: Four times a day (QID) | ORAL | Status: DC | PRN
  Administered 2014-03-05: 5 mg via ORAL
  Filled 2014-03-05: qty 1

## 2014-03-05 MED ORDER — MENTHOL 3 MG MT LOZG: 1.0000 | LOZENGE | OROMUCOSAL | Status: DC | PRN

## 2014-03-05 MED ORDER — PHENYLEPHRINE HCL 10 MG/ML IJ SOLN
INTRAMUSCULAR | Status: DC | PRN
  Administered 2014-03-05: 40 ug via INTRAVENOUS

## 2014-03-05 MED ORDER — PHENOL 1.4 % MT LIQD: 1.0000 | OROMUCOSAL | Status: DC | PRN

## 2014-03-05 MED ORDER — ONDANSETRON HCL 4 MG/2ML IJ SOLN: 4.0000 mg | Freq: Once | INTRAMUSCULAR | Status: DC | PRN

## 2014-03-05 MED ORDER — HYPROMELLOSE (GONIOSCOPIC) 2.5 % OP SOLN
1.0000 [drp] | Freq: Two times a day (BID) | OPHTHALMIC | Status: DC | PRN
  Filled 2014-03-05: qty 15

## 2014-03-05 MED ORDER — SODIUM CHLORIDE 0.9 % IV SOLN: 250.0000 mL | INTRAVENOUS | Status: DC

## 2014-03-05 MED ORDER — SODIUM CHLORIDE 0.9 % IJ SOLN: 3.0000 mL | INTRAMUSCULAR | Status: DC | PRN

## 2014-03-05 MED ORDER — ACETAMINOPHEN 650 MG RE SUPP: 650.0000 mg | RECTAL | Status: DC | PRN

## 2014-03-05 MED ORDER — KETOROLAC TROMETHAMINE 30 MG/ML IJ SOLN
30.0000 mg | Freq: Four times a day (QID) | INTRAMUSCULAR | Status: DC
  Administered 2014-03-05 – 2014-03-06 (×2): 30 mg via INTRAVENOUS
  Filled 2014-03-05 (×5): qty 1

## 2014-03-05 MED ORDER — FOLIC ACID 1 MG PO TABS
1.0000 mg | ORAL_TABLET | Freq: Every day | ORAL | Status: DC
  Filled 2014-03-05: qty 1

## 2014-03-05 MED ORDER — PANTOPRAZOLE SODIUM 40 MG PO TBEC
80.0000 mg | DELAYED_RELEASE_TABLET | Freq: Every day | ORAL | Status: DC
  Administered 2014-03-05: 80 mg via ORAL
  Filled 2014-03-05: qty 2

## 2014-03-05 MED ORDER — HYDROMORPHONE HCL PF 1 MG/ML IJ SOLN
INTRAMUSCULAR | Status: AC
  Filled 2014-03-05: qty 1

## 2014-03-05 MED ORDER — LIDOCAINE HCL (CARDIAC) 20 MG/ML IV SOLN
INTRAVENOUS | Status: DC | PRN
  Administered 2014-03-05: 60 mg via INTRAVENOUS

## 2014-03-05 MED ORDER — LOSARTAN POTASSIUM 50 MG PO TABS
100.0000 mg | ORAL_TABLET | Freq: Every day | ORAL | Status: DC
  Filled 2014-03-05: qty 2

## 2014-03-05 MED ORDER — AMLODIPINE BESYLATE 2.5 MG PO TABS
2.5000 mg | ORAL_TABLET | Freq: Every day | ORAL | Status: DC
  Administered 2014-03-06: 2.5 mg via ORAL
  Filled 2014-03-05 (×2): qty 1

## 2014-03-05 MED ORDER — MIDAZOLAM HCL 2 MG/2ML IJ SOLN
INTRAMUSCULAR | Status: AC
  Filled 2014-03-05: qty 2

## 2014-03-05 MED ORDER — SENNA 8.6 MG PO TABS
1.0000 | ORAL_TABLET | Freq: Two times a day (BID) | ORAL | Status: DC
Start: 2014-03-05 — End: 2014-03-06
  Administered 2014-03-05: 8.6 mg via ORAL
  Filled 2014-03-05 (×3): qty 1

## 2014-03-05 MED ORDER — FUROSEMIDE 20 MG PO TABS
20.0000 mg | ORAL_TABLET | Freq: Every day | ORAL | Status: DC
  Filled 2014-03-05: qty 1

## 2014-03-05 MED ORDER — THROMBIN 20000 UNITS EX SOLR
CUTANEOUS | Status: DC | PRN
  Administered 2014-03-05: 15:00:00 via TOPICAL

## 2014-03-05 MED ORDER — ACETAMINOPHEN 325 MG PO TABS: 650.0000 mg | ORAL_TABLET | ORAL | Status: DC | PRN

## 2014-03-05 MED ORDER — KETOROLAC TROMETHAMINE 30 MG/ML IJ SOLN
INTRAMUSCULAR | Status: DC | PRN
  Administered 2014-03-05: 30 mg via INTRAVENOUS

## 2014-03-05 MED ORDER — FENTANYL CITRATE 0.05 MG/ML IJ SOLN
INTRAMUSCULAR | Status: AC
  Filled 2014-03-05: qty 5

## 2014-03-05 MED ORDER — FENTANYL CITRATE 0.05 MG/ML IJ SOLN
INTRAMUSCULAR | Status: DC | PRN
  Administered 2014-03-05: 100 ug via INTRAVENOUS

## 2014-03-05 MED ORDER — TIOTROPIUM BROMIDE MONOHYDRATE 18 MCG IN CAPS
18.0000 ug | ORAL_CAPSULE | Freq: Every day | RESPIRATORY_TRACT | Status: DC
  Filled 2014-03-05: qty 5

## 2014-03-05 MED ORDER — LIDOCAINE HCL (CARDIAC) 20 MG/ML IV SOLN
INTRAVENOUS | Status: AC
  Filled 2014-03-05: qty 5

## 2014-03-05 MED ORDER — NEOSTIGMINE METHYLSULFATE 10 MG/10ML IV SOLN
INTRAVENOUS | Status: DC | PRN
  Administered 2014-03-05: 4 mg via INTRAVENOUS

## 2014-03-05 MED ORDER — ROCURONIUM BROMIDE 100 MG/10ML IV SOLN
INTRAVENOUS | Status: DC | PRN
  Administered 2014-03-05: 40 mg via INTRAVENOUS

## 2014-03-05 MED ORDER — LIDOCAINE HCL 4 % MT SOLN
OROMUCOSAL | Status: DC | PRN
  Administered 2014-03-05: 4 mL via TOPICAL

## 2014-03-05 MED ORDER — 0.9 % SODIUM CHLORIDE (POUR BTL) OPTIME
TOPICAL | Status: DC | PRN
  Administered 2014-03-05: 1000 mL

## 2014-03-05 MED ORDER — ONDANSETRON HCL 4 MG/2ML IJ SOLN: 4.0000 mg | INTRAMUSCULAR | Status: DC | PRN

## 2014-03-05 MED ORDER — POLYMYXIN B SULFATE 500000 UNITS IJ SOLR
INTRAMUSCULAR | Status: DC | PRN
  Administered 2014-03-05: 15:00:00

## 2014-03-05 MED ORDER — ALBUTEROL SULFATE HFA 108 (90 BASE) MCG/ACT IN AERS: 1.0000 | INHALATION_SPRAY | Freq: Four times a day (QID) | RESPIRATORY_TRACT | Status: DC | PRN

## 2014-03-05 MED ORDER — ALBUTEROL SULFATE HFA 108 (90 BASE) MCG/ACT IN AERS: 2.0000 | INHALATION_SPRAY | RESPIRATORY_TRACT | Status: DC | PRN

## 2014-03-05 MED ORDER — LEVOTHYROXINE SODIUM 125 MCG PO TABS
125.0000 ug | ORAL_TABLET | Freq: Every day | ORAL | Status: DC
Start: 2014-03-06 — End: 2014-03-06
  Administered 2014-03-06: 125 ug via ORAL
  Filled 2014-03-05 (×2): qty 1

## 2014-03-05 MED ORDER — PROPOFOL 10 MG/ML IV BOLUS
INTRAVENOUS | Status: DC | PRN
Start: 2014-03-05 — End: 2014-03-05
  Administered 2014-03-05: 200 mg via INTRAVENOUS

## 2014-03-05 MED ORDER — LACTATED RINGERS IV SOLN
INTRAVENOUS | Status: DC
  Administered 2014-03-05: 12:00:00 via INTRAVENOUS

## 2014-03-05 MED ORDER — SODIUM CHLORIDE 0.9 % IJ SOLN
3.0000 mL | Freq: Two times a day (BID) | INTRAMUSCULAR | Status: DC
  Administered 2014-03-05: 3 mL via INTRAVENOUS

## 2014-03-05 MED ORDER — SIMVASTATIN 40 MG PO TABS
40.0000 mg | ORAL_TABLET | Freq: Every day | ORAL | Status: DC
  Administered 2014-03-05: 40 mg via ORAL
  Filled 2014-03-05 (×2): qty 1

## 2014-03-05 MED ORDER — GLYCOPYRROLATE 0.2 MG/ML IJ SOLN
INTRAMUSCULAR | Status: DC | PRN
  Administered 2014-03-05: 0.6 mg via INTRAVENOUS

## 2014-03-05 MED ORDER — NEOSTIGMINE METHYLSULFATE 10 MG/10ML IV SOLN
INTRAVENOUS | Status: AC
  Filled 2014-03-05: qty 1

## 2014-03-05 MED ORDER — DEXAMETHASONE SODIUM PHOSPHATE 4 MG/ML IJ SOLN
INTRAMUSCULAR | Status: AC
  Filled 2014-03-05: qty 3

## 2014-03-05 MED ORDER — BUPIVACAINE HCL (PF) 0.25 % IJ SOLN
INTRAMUSCULAR | Status: DC | PRN
  Administered 2014-03-05: 20 mL

## 2014-03-05 MED ORDER — HYDROMORPHONE HCL PF 1 MG/ML IJ SOLN: 0.5000 mg | INTRAMUSCULAR | Status: DC | PRN

## 2014-03-05 MED ORDER — MIDAZOLAM HCL 5 MG/5ML IJ SOLN
INTRAMUSCULAR | Status: DC | PRN
  Administered 2014-03-05: 1 mg via INTRAVENOUS

## 2014-03-05 MED ORDER — GLYCOPYRROLATE 0.2 MG/ML IJ SOLN
INTRAMUSCULAR | Status: AC
  Filled 2014-03-05: qty 3

## 2014-03-05 MED ORDER — PROPOFOL 10 MG/ML IV BOLUS
INTRAVENOUS | Status: AC
  Filled 2014-03-05: qty 20

## 2014-03-05 MED ORDER — POTASSIUM CHLORIDE CRYS ER 10 MEQ PO TBCR
10.0000 meq | EXTENDED_RELEASE_TABLET | Freq: Every day | ORAL | Status: DC
  Administered 2014-03-05: 10 meq via ORAL
  Filled 2014-03-05 (×2): qty 1

## 2014-03-05 MED ORDER — CEFAZOLIN SODIUM 1-5 GM-% IV SOLN
1.0000 g | Freq: Three times a day (TID) | INTRAVENOUS | Status: AC
  Administered 2014-03-05 – 2014-03-06 (×2): 1 g via INTRAVENOUS
  Filled 2014-03-05 (×2): qty 50

## 2014-03-05 MED ORDER — LACTATED RINGERS IV SOLN
INTRAVENOUS | Status: DC | PRN
  Administered 2014-03-05: 15:00:00 via INTRAVENOUS

## 2014-03-05 MED ORDER — ALUM & MAG HYDROXIDE-SIMETH 200-200-20 MG/5ML PO SUSP
30.0000 mL | Freq: Four times a day (QID) | ORAL | Status: DC | PRN
  Administered 2014-03-06: 30 mL via ORAL
  Filled 2014-03-05: qty 30

## 2014-03-05 MED ORDER — ONDANSETRON HCL 4 MG/2ML IJ SOLN
INTRAMUSCULAR | Status: DC | PRN
  Administered 2014-03-05: 4 mg via INTRAVENOUS

## 2014-03-05 MED ORDER — HYDROMORPHONE HCL PF 1 MG/ML IJ SOLN
0.2500 mg | INTRAMUSCULAR | Status: DC | PRN
  Administered 2014-03-05 (×2): 0.5 mg via INTRAVENOUS

## 2014-03-05 SURGICAL SUPPLY — 50 items
ADH SKN CLS APL DERMABOND .7 (GAUZE/BANDAGES/DRESSINGS) ×1
APL SKNCLS STERI-STRIP NONHPOA (GAUZE/BANDAGES/DRESSINGS) ×1
BAG DECANTER FOR FLEXI CONT (MISCELLANEOUS) ×2 IMPLANT
BENZOIN TINCTURE PRP APPL 2/3 (GAUZE/BANDAGES/DRESSINGS) ×2 IMPLANT
BLADE 10 SAFETY STRL DISP (BLADE) ×2 IMPLANT
BLADE SURG ROTATE 9660 (MISCELLANEOUS) IMPLANT
BRUSH SCRUB EZ PLAIN DRY (MISCELLANEOUS) ×2 IMPLANT
BUR CUTTER 7.0 ROUND (BURR) ×2 IMPLANT
CANISTER SUCT 3000ML (MISCELLANEOUS) ×2 IMPLANT
CONT SPEC 4OZ CLIKSEAL STRL BL (MISCELLANEOUS) ×2 IMPLANT
DECANTER SPIKE VIAL GLASS SM (MISCELLANEOUS) ×2 IMPLANT
DERMABOND ADVANCED (GAUZE/BANDAGES/DRESSINGS) ×1
DERMABOND ADVANCED .7 DNX12 (GAUZE/BANDAGES/DRESSINGS) ×1 IMPLANT
DRAPE LAPAROTOMY 100X72X124 (DRAPES) ×2 IMPLANT
DRAPE MICROSCOPE ZEISS OPMI (DRAPES) ×2 IMPLANT
DRAPE POUCH INSTRU U-SHP 10X18 (DRAPES) ×2 IMPLANT
DRAPE PROXIMA HALF (DRAPES) ×1 IMPLANT
DRAPE SURG 17X23 STRL (DRAPES) ×4 IMPLANT
DRSG OPSITE POSTOP 4X6 (GAUZE/BANDAGES/DRESSINGS) ×1 IMPLANT
ELECT REM PT RETURN 9FT ADLT (ELECTROSURGICAL) ×2
ELECTRODE REM PT RTRN 9FT ADLT (ELECTROSURGICAL) ×1 IMPLANT
GAUZE SPONGE 4X4 16PLY XRAY LF (GAUZE/BANDAGES/DRESSINGS) IMPLANT
GLOVE ECLIPSE 9.0 STRL (GLOVE) ×2 IMPLANT
GLOVE EXAM NITRILE LRG STRL (GLOVE) IMPLANT
GLOVE EXAM NITRILE MD LF STRL (GLOVE) IMPLANT
GLOVE EXAM NITRILE XL STR (GLOVE) IMPLANT
GLOVE EXAM NITRILE XS STR PU (GLOVE) IMPLANT
GOWN STRL REUS W/ TWL LRG LVL3 (GOWN DISPOSABLE) IMPLANT
GOWN STRL REUS W/ TWL XL LVL3 (GOWN DISPOSABLE) ×1 IMPLANT
GOWN STRL REUS W/TWL 2XL LVL3 (GOWN DISPOSABLE) IMPLANT
GOWN STRL REUS W/TWL LRG LVL3 (GOWN DISPOSABLE)
GOWN STRL REUS W/TWL XL LVL3 (GOWN DISPOSABLE) ×2
KIT BASIN OR (CUSTOM PROCEDURE TRAY) ×2 IMPLANT
KIT ROOM TURNOVER OR (KITS) ×2 IMPLANT
NDL SPNL 22GX3.5 QUINCKE BK (NEEDLE) ×1 IMPLANT
NEEDLE HYPO 22GX1.5 SAFETY (NEEDLE) ×2 IMPLANT
NEEDLE SPNL 22GX3.5 QUINCKE BK (NEEDLE) ×2 IMPLANT
NS IRRIG 1000ML POUR BTL (IV SOLUTION) ×2 IMPLANT
PACK LAMINECTOMY NEURO (CUSTOM PROCEDURE TRAY) ×2 IMPLANT
PAD ARMBOARD 7.5X6 YLW CONV (MISCELLANEOUS) ×6 IMPLANT
RUBBERBAND STERILE (MISCELLANEOUS) ×4 IMPLANT
SPONGE GAUZE 4X4 12PLY (GAUZE/BANDAGES/DRESSINGS) ×2 IMPLANT
SPONGE SURGIFOAM ABS GEL 100 (HEMOSTASIS) ×2 IMPLANT
STRIP CLOSURE SKIN 1/2X4 (GAUZE/BANDAGES/DRESSINGS) ×2 IMPLANT
SUT VIC AB 2-0 CT1 18 (SUTURE) ×2 IMPLANT
SUT VIC AB 3-0 SH 8-18 (SUTURE) ×2 IMPLANT
SYR 20ML ECCENTRIC (SYRINGE) ×2 IMPLANT
TOWEL OR 17X24 6PK STRL BLUE (TOWEL DISPOSABLE) ×2 IMPLANT
TOWEL OR 17X26 10 PK STRL BLUE (TOWEL DISPOSABLE) ×2 IMPLANT
WATER STERILE IRR 1000ML POUR (IV SOLUTION) ×2 IMPLANT

## 2014-03-05 NOTE — Brief Op Note (Signed)
03/05/2014  3:29 PM  PATIENT:  Carolyn Faulkner  68 y.o. female  PRE-OPERATIVE DIAGNOSIS:  Painful hardware  POST-OPERATIVE DIAGNOSIS:  Painful hardware  PROCEDURE:  Procedure(s): HARDWARE REMOVAL (Left)  SURGEON:  Surgeon(s) and Role:    * Temple Pacini, MD - Primary  PHYSICIAN ASSISTANT:   ASSISTANTS:    ANESTHESIA:   general  EBL:     BLOOD ADMINISTERED:none  DRAINS: none   LOCAL MEDICATIONS USED:  MARCAINE     SPECIMEN:  No Specimen  DISPOSITION OF SPECIMEN:  N/A  COUNTS:  YES  TOURNIQUET:  * No tourniquets in log *  DICTATION: .Dragon Dictation  PLAN OF CARE: Admit to inpatient   PATIENT DISPOSITION:  PACU - hemodynamically stable.   Delay start of Pharmacological VTE agent (>24hrs) due to surgical blood loss or risk of bleeding: yes

## 2014-03-05 NOTE — Op Note (Signed)
Date of procedure: 03/05/2014  Date of dictation: Same  Service: Neurosurgery  Preoperative diagnosis: Painful left L4-5 instrumentation  Postoperative diagnosis: Same  Procedure Name: Removal left L4-5 nonsegmental pedicle screw instrumentation  Surgeon:Lonnie Reth A.Eduardo Wurth, M.D.  Asst. Surgeon: None  Anesthesia: General  Indication: 68 year old female presently 4 years status post L4-5 decompression and fusion. Patient has gone on to achieve solid bony fusion that had some persistent left lower extremity pain thought to be secondary to a laterally placed screw at L5 which causes some irritation of her left psoas muscle and left lumbar plexus. Patient presents now for removal of symptomatic instrumentation.  Operative note: After induction of anesthesia, patient position prone onto the Wilson frame in a properly padded. Lumbar region prepped and draped. Incision made her medially on the left. Dissection proceeds down to the lumbodorsal fascia. Fascia divided. Paraspinal muscles separated in instrumentation exposed. L4-5 as dictation disassembled. Rod and To remove. Screws removed. Fusion inspected and insured to be solid. Wound is irrigated with antibiotic Solution. Is then closed in typical fashion. Steri-Strips and sterile dressing were applied.

## 2014-03-05 NOTE — Anesthesia Preprocedure Evaluation (Signed)
Anesthesia Evaluation  Patient identified by MRN, date of birth, ID band Patient awake    Reviewed: Allergy & Precautions, H&P , NPO status , Patient's Chart, lab work & pertinent test results  Airway       Dental   Pulmonary shortness of breath, COPDformer smoker,          Cardiovascular hypertension, + CAD, + Past MI and + Cardiac Stents     Neuro/Psych    GI/Hepatic GERD-  ,  Endo/Other  Hypothyroidism Morbid obesity  Renal/GU      Musculoskeletal   Abdominal   Peds  Hematology   Anesthesia Other Findings   Reproductive/Obstetrics                           Anesthesia Physical Anesthesia Plan  ASA: III  Anesthesia Plan: General   Post-op Pain Management:    Induction: Intravenous  Airway Management Planned: Oral ETT  Additional Equipment:   Intra-op Plan:   Post-operative Plan: Extubation in OR  Informed Consent: I have reviewed the patients History and Physical, chart, labs and discussed the procedure including the risks, benefits and alternatives for the proposed anesthesia with the patient or authorized representative who has indicated his/her understanding and acceptance.     Plan Discussed with:   Anesthesia Plan Comments:         Anesthesia Quick Evaluation

## 2014-03-05 NOTE — Transfer of Care (Signed)
Immediate Anesthesia Transfer of Care Note  Patient: Carolyn Faulkner  Procedure(s) Performed: Procedure(s): HARDWARE REMOVAL (Left)  Patient Location: PACU  Anesthesia Type:General  Level of Consciousness: awake, alert  and oriented  Airway & Oxygen Therapy: Patient Spontanous Breathing  Post-op Assessment: Report given to PACU RN  Post vital signs: stable  Complications: No apparent anesthesia complications

## 2014-03-05 NOTE — Anesthesia Postprocedure Evaluation (Signed)
  Anesthesia Post-op Note  Patient: Carolyn Faulkner  Procedure(s) Performed: Procedure(s): HARDWARE REMOVAL (Left)  Patient Location: PACU  Anesthesia Type:General  Level of Consciousness: awake, alert , oriented and patient cooperative  Airway and Oxygen Therapy: Patient Spontanous Breathing  Post-op Pain: mild  Post-op Assessment: Post-op Vital signs reviewed, Patient's Cardiovascular Status Stable, Respiratory Function Stable, Patent Airway and No signs of Nausea or vomiting  Post-op Vital Signs: stable  Last Vitals:  Filed Vitals:   03/05/14 1615  BP:   Pulse: 42  Temp:   Resp: 8    Complications: No apparent anesthesia complications
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
## 2014-03-06 NOTE — Progress Notes (Signed)
Pt doing well. Pt and son given D/C instructions with verbal understanding of teaching. Pt's IV was removed prior to D/C. Pt D/C'd home via wheelchair @ 0900 per MD order. Pt is stable @ D/C and has no other needs at this time. Rema Fendt, RN

## 2014-03-06 NOTE — Progress Notes (Signed)
Patient ID: Carolyn Faulkner, female   DOB: 1946/04/12, 68 y.o.   MRN: [id number] Doing well no leg pain plan discharge home

## 2014-03-06 NOTE — Plan of Care (Signed)
Problem: Consults Goal: Diagnosis - Spinal Surgery Outcome: Completed/Met Date Met:  03/05/14 Hardware removal on L4-5

## 2014-03-06 NOTE — Discharge Instructions (Signed)
No lifting no bending no twisting no driving a riding a car unless she's come back and forth to see Dr. Jordan Likes  Wound Care Keep incision covered and dry for one week.  If you shower prior to then, cover incision with plastic wrap.  You may remove outer bandage after one week and shower.  Do not put any creams, lotions, or ointments on incision. Leave steri-strips on neck.  They will fall off by themselves. Activity Walk each and every day, increasing distance each day. No lifting greater than 5 lbs.  Avoid excessive neck motion. No driving for 2 weeks; may ride as a passenger locally. If provided with back brace, wear when out of bed.  It is not necessary to wear brace in bed. Diet Resume your normal diet.  Return to Work Will be discussed at you follow up appointment. Call Your Doctor If Any of These Occur Redness, drainage, or swelling at the wound.  Temperature greater than 101 degrees. Severe pain not relieved by pain medication. Incision starts to come apart. Follow Up Appt Call today for appointment in 1-2 weeks [phone]) or for problems.  If you have any hardware placed in your spine, you will need an x-ray before your appointment.

## 2014-03-06 NOTE — Discharge Summary (Signed)
Physician Discharge Summary  Patient ID: Carolyn Faulkner MRN: [id number] DOB/AGE: [birth year] 67 y.o.  Admit date: 03/05/2014 Discharge date: 03/06/2014  Admission Diagnoses: Painful instrumentation  Discharge Diagnoses: Painful instrumentation Principal Problem:   Painful orthopaedic hardware   Discharged Condition: good  Hospital Course: Patient admitted hospital underwent removal of hardware postoperative very well recovered in the floor on the floor she was angling and voiding spontaneously tolerating rigid diet was stable for discharge home.  Consults: Significant Diagnostic Studies: Treatments: Removal of hardware Discharge Exam: Blood pressure 132/78, pulse 49, temperature 97.9 F (36.6 C), temperature source Oral, resp. rate 18, height 5' 9.25" (1.759 m), weight 106 kg (233 lb 11 oz), SpO2 96.00%. Strength out of 5 wound clean and dry  Disposition: Home     Medication List    TAKE these medications       acetaminophen 325 MG tablet  Commonly known as:  TYLENOL  Take 325 mg by mouth every 6 (six) hours as needed. pain     albuterol 108 (90 BASE) MCG/ACT inhaler  Commonly known as:  PROVENTIL HFA;VENTOLIN HFA  Take 1 puff 4 times a day     albuterol 108 (90 BASE) MCG/ACT inhaler  Commonly known as:  PROVENTIL HFA;VENTOLIN HFA  Inhale 1 puff into the lungs every 6 (six) hours as needed for wheezing or shortness of breath.     amLODipine 2.5 MG tablet  Commonly known as:  NORVASC  Take 2.5 mg by mouth daily before breakfast.     aspirin 81 MG tablet  Take 81 mg by mouth daily.     folic acid 1 MG tablet  Commonly known as:  FOLVITE  Take 1 mg by mouth daily.     hydroxypropyl methylcellulose 2.5 % ophthalmic solution  Commonly known as:  ISOPTO TEARS  Place 1-2 drops into both eyes 2 (two) times daily as needed for dry eyes.     ibuprofen 200 MG tablet  Commonly known as:  ADVIL,MOTRIN  Take 400 mg by mouth every 6 (six) hours as needed.     levothyroxine  125 MCG tablet  Commonly known as:  SYNTHROID, LEVOTHROID  Take 125 mcg by mouth daily before breakfast.     losartan 100 MG tablet  Commonly known as:  COZAAR  Take 100 mg by mouth daily.     Melatonin 10 MG Tabs  Take 10 mg by mouth at bedtime.     methotrexate 2.5 MG tablet  Commonly known as:  RHEUMATREX  - Take 20 mg by mouth once a week. 8 tablets Caution:Chemotherapy. Protect from light.  - Taken on Saturdays     omeprazole 40 MG capsule  Commonly known as:  PRILOSEC  Take 40 mg by mouth daily before breakfast.     oxyCODONE 5 MG immediate release tablet  Commonly known as:  Oxy IR/ROXICODONE  Take 5 mg by mouth 4 (four) times daily as needed for moderate pain or severe pain.     potassium chloride 10 MEQ CR tablet  Commonly known as:  KLOR-CON  Take 20 mEq by mouth daily.     pravastatin 80 MG tablet  Commonly known as:  PRAVACHOL  Take 80 mg by mouth daily.     tiotropium 18 MCG inhalation capsule  Commonly known as:  SPIRIVA  Place 18 mcg into inhaler and inhale daily.      ASK your doctor about these medications       furosemide 20 MG tablet  Commonly known as:  LASIX  Take 20 mg by mouth daily.           Follow-up Information   Follow up with Temple Pacini, MD.   Specialty:  Neurosurgery   Contact information:   [address] [place] [address] [phone]       Signed: Anessa Charley P 03/06/2014, 6:41 AM

## 2014-03-08 ENCOUNTER — Encounter (HOSPITAL_COMMUNITY): Payer: Self-pay | Admitting: Neurosurgery

## 2014-07-23 ENCOUNTER — Other Ambulatory Visit: Payer: Self-pay | Admitting: Internal Medicine

## 2014-08-04 ENCOUNTER — Encounter (HOSPITAL_COMMUNITY): Payer: Self-pay | Admitting: Cardiology

## 2015-01-27 ENCOUNTER — Other Ambulatory Visit: Payer: Self-pay | Admitting: Physician Assistant

## 2015-01-27 DIAGNOSIS — Z780 Asymptomatic menopausal state: Secondary | ICD-10-CM

## 2015-01-27 DIAGNOSIS — Z1231 Encounter for screening mammogram for malignant neoplasm of breast: Secondary | ICD-10-CM

## 2015-02-01 ENCOUNTER — Other Ambulatory Visit: Payer: Self-pay

## 2015-02-01 ENCOUNTER — Ambulatory Visit: Payer: Self-pay

## 2015-02-07 ENCOUNTER — Ambulatory Visit
Admission: RE | Admit: 2015-02-07 | Discharge: 2015-02-07 | Disposition: A | Discharge status: Home / Self Care | Payer: Medicare (Managed Care) | Source: Ambulatory Visit | Attending: Physician Assistant | Admitting: Physician Assistant

## 2015-02-07 DIAGNOSIS — Z780 Asymptomatic menopausal state: Secondary | ICD-10-CM

## 2015-02-07 DIAGNOSIS — Z1231 Encounter for screening mammogram for malignant neoplasm of breast: Secondary | ICD-10-CM

## 2015-02-08 ENCOUNTER — Other Ambulatory Visit: Payer: Self-pay | Admitting: Physician Assistant

## 2015-02-08 DIAGNOSIS — R928 Other abnormal and inconclusive findings on diagnostic imaging of breast: Secondary | ICD-10-CM

## 2015-02-16 ENCOUNTER — Ambulatory Visit
Admission: RE | Admit: 2015-02-16 | Discharge: 2015-02-16 | Disposition: A | Discharge status: Home / Self Care | Payer: Medicare (Managed Care) | Source: Ambulatory Visit | Attending: Physician Assistant | Admitting: Physician Assistant

## 2015-02-16 DIAGNOSIS — R928 Other abnormal and inconclusive findings on diagnostic imaging of breast: Secondary | ICD-10-CM

## 2015-04-01 ENCOUNTER — Ambulatory Visit (INDEPENDENT_AMBULATORY_CARE_PROVIDER_SITE_OTHER): Payer: Medicare (Managed Care) | Admitting: Internal Medicine

## 2015-04-01 ENCOUNTER — Telehealth: Payer: Self-pay | Admitting: Internal Medicine

## 2015-04-01 ENCOUNTER — Encounter: Payer: Self-pay | Admitting: Internal Medicine

## 2015-04-01 VITALS — BP 152/72 | HR 62 | Ht 69.0 in | Wt 270.2 lb

## 2015-04-01 DIAGNOSIS — R0602 Shortness of breath: Secondary | ICD-10-CM | POA: Diagnosis not present

## 2015-04-01 DIAGNOSIS — R079 Chest pain, unspecified: Secondary | ICD-10-CM | POA: Diagnosis not present

## 2015-04-01 DIAGNOSIS — E039 Hypothyroidism, unspecified: Secondary | ICD-10-CM | POA: Diagnosis not present

## 2015-04-01 LAB — BASIC METABOLIC PANEL
BUN: 10 mg/dL (ref 6–23)
CHLORIDE: 103 meq/L (ref 96–112)
CO2: 32 mEq/L (ref 19–32)
CREATININE: 0.89 mg/dL (ref 0.40–1.20)
Calcium: 9.5 mg/dL (ref 8.4–10.5)
GFR: 67.02 mL/min (ref >60.00)
Glucose, Bld: 140 mg/dL — ABNORMAL HIGH (ref 70–99)
POTASSIUM: 3.8 meq/L (ref 3.5–5.1)
Sodium: 142 mEq/L (ref 135–145)

## 2015-04-01 LAB — CBC WITH DIFFERENTIAL/PLATELET
BASOS ABS: 0 10*3/uL (ref 0.0–0.1)
Basophils Relative: 0.2 % (ref 0.0–3.0)
EOS ABS: 0.1 10*3/uL (ref 0.0–0.7)
EOS PCT: 0.8 % (ref 0.0–5.0)
HCT: 38.9 % (ref 36.0–46.0)
HEMOGLOBIN: 12.8 g/dL (ref 12.0–15.0)
Lymphocytes Relative: 18.2 % (ref 12.0–46.0)
Lymphs Abs: 2.1 10*3/uL (ref 0.7–4.0)
MCHC: 32.9 g/dL (ref 30.0–36.0)
MCV: 91.2 fl (ref 78.0–100.0)
MONOS PCT: 6.6 % (ref 3.0–12.0)
Monocytes Absolute: 0.8 10*3/uL (ref 0.1–1.0)
NEUTROS PCT: 74.2 % (ref 43.0–77.0)
Neutro Abs: 8.5 10*3/uL — ABNORMAL HIGH (ref 1.4–7.7)
Platelets: 224 10*3/uL (ref 150.0–400.0)
RBC: 4.26 Mil/uL (ref 3.87–5.11)
RDW: 13.2 % (ref 11.5–15.5)
WBC: 11.5 10*3/uL — ABNORMAL HIGH (ref 4.0–10.5)

## 2015-04-01 LAB — TSH: TSH: 2.56 u[IU]/mL (ref 0.35–4.50)

## 2015-04-01 LAB — BRAIN NATRIURETIC PEPTIDE: Pro B Natriuretic peptide (BNP): 174 pg/mL — ABNORMAL HIGH (ref 0.0–100.0)

## 2015-04-01 LAB — TROPONIN I: TNIDX: 0.11 ug/L — AB (ref 0.00–0.06)

## 2015-04-01 NOTE — Telephone Encounter (Signed)
Pt c/o Shortness Of Breath: STAT if SOB developed within the last 24 hours or pt is noticeably SOB on the phone  1. Are you currently SOB (can you hear that pt is SOB on the phone)? yes  2. How long have you been experiencing SOB? Last week, told PCP get worse  3. Are you SOB when sitting or when up moving around? Walking   4. Are you currently experiencing any other symptoms? BP getting high and tightness in chest

## 2015-04-01 NOTE — Patient Instructions (Signed)
Medication Instructions: - no changes  Labwork: - Your physician recommends that you have lab work today: cbc/bmp/d-dimer/tsh/bnp/troponin  Procedures/Testing: - Your physician has requested that you have an echocardiogram. Echocardiography is a painless test that uses sound waves to create images of your heart. It provides your doctor with information about the size and shape of your heart and how well your heart's chambers and valves are working. This procedure takes approximately one hour. There are no restrictions for this procedure.  Follow-Up: - pending results of your echo  Any Additional Special Instructions Will Be Listed Below (If Applicable). - none
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
## 2015-04-01 NOTE — Telephone Encounter (Signed)
Patient c/o SOB for over one week.  Patient went to PCP last week to get her foot swelling under control (they have been playing with her diuretics - she is no longer on Lasix and is taking 25 mg HCTZ - she st this is working well for her).  At this visit, she mentioned she was slightly SOB but it was not a huge concern. Since the visit, the SOB has worsened, especially with exertion. The patient was walking while on the phone and she became short of breath.  The patient st since her SOB has worsened, she also experiences tightness in her chest when taking deep breaths that "feel like bad, deep indigestion."  BP: 176/76 HR: 59 Per Dr. Tenny Craw, patient added to schedule today. Patient grateful for quick response.

## 2015-04-02 ENCOUNTER — Telehealth: Payer: Self-pay | Admitting: Internal Medicine

## 2015-04-02 DIAGNOSIS — R0602 Shortness of breath: Secondary | ICD-10-CM

## 2015-04-02 NOTE — Telephone Encounter (Signed)
Pt feeling better  Will call in Rx for Lasix 40 with 20 KCL  Take every few days  [facility] [place] [phone]  Office will call re labs

## 2015-04-04 MED ORDER — FUROSEMIDE 40 MG PO TABS: ORAL_TABLET | ORAL | Status: DC

## 2015-04-04 NOTE — Telephone Encounter (Signed)
I called the patient this morning with her lab results. She did say that Dr. Tenny Craw called her over the weekend to follow up. She reports she is already taking Potassium 10 meq tablets- two tablets (20 meq) daily. She has lasix at home already, but they are 20 mg tablets. I have instructed her to take lasix 20 mg two tablets (40 mg) once three to four days a week. I have advised her to monitor her weight and call with any acute changes. She will come for her echo on 04/06/15. She have a repeat BMP/ BNP on 04/18/15.

## 2015-04-04 NOTE — Addendum Note (Signed)
Addended by: Sherri Rad C on: 04/04/2015 08:56 AM   Modules accepted: Orders

## 2015-04-06 ENCOUNTER — Ambulatory Visit (HOSPITAL_COMMUNITY): Payer: No Typology Code available for payment source | Attending: Internal Medicine

## 2015-04-06 ENCOUNTER — Other Ambulatory Visit: Payer: Self-pay

## 2015-04-06 DIAGNOSIS — R0602 Shortness of breath: Secondary | ICD-10-CM

## 2015-04-06 DIAGNOSIS — I350 Nonrheumatic aortic (valve) stenosis: Secondary | ICD-10-CM | POA: Diagnosis not present

## 2015-04-06 DIAGNOSIS — R0600 Dyspnea, unspecified: Secondary | ICD-10-CM | POA: Diagnosis present

## 2015-04-18 ENCOUNTER — Other Ambulatory Visit (INDEPENDENT_AMBULATORY_CARE_PROVIDER_SITE_OTHER): Payer: Medicare (Managed Care)

## 2015-04-18 DIAGNOSIS — R0602 Shortness of breath: Secondary | ICD-10-CM | POA: Diagnosis not present

## 2015-04-18 LAB — BASIC METABOLIC PANEL
BUN: 13 mg/dL (ref 6–23)
CALCIUM: 9.5 mg/dL (ref 8.4–10.5)
CO2: 31 meq/L (ref 19–32)
Chloride: 101 mEq/L (ref 96–112)
Creatinine, Ser: 0.88 mg/dL (ref 0.40–1.20)
GFR: 67.89 mL/min (ref >60.00)
Glucose, Bld: 117 mg/dL — ABNORMAL HIGH (ref 70–99)
Potassium: 3.9 mEq/L (ref 3.5–5.1)
SODIUM: 141 meq/L (ref 135–145)

## 2015-04-18 LAB — BRAIN NATRIURETIC PEPTIDE: Pro B Natriuretic peptide (BNP): 92 pg/mL (ref 0.0–100.0)

## 2015-07-24 ENCOUNTER — Other Ambulatory Visit: Payer: Self-pay | Admitting: Internal Medicine

## 2015-11-16 IMAGING — CT CT L SPINE W/O CM
4 of 11 series · 10 of 33 positions shown, 12 images · non-contrast
Comparison: 12/31/2012 plain film exam and MR.

CLINICAL DATA: Low back pain with left flank and leg pain for the
past 2 years. Spinal stenosis. Lumbar fusion 3733. No trauma. No
history of cancer.

EXAM:
CT LUMBAR SPINE WITHOUT CONTRAST
TECHNIQUE: Multidetector CT imaging of the lumbar spine was performed without
intravenous contrast administration. Multiplanar CT image
reconstructions were also generated.

[Series 2: l spine bone · axial · 0.27mm/px · z∈[-232,-146]mm · 2 of 104 slices shown, 3 images]
[im 35/104  soft-tissue]
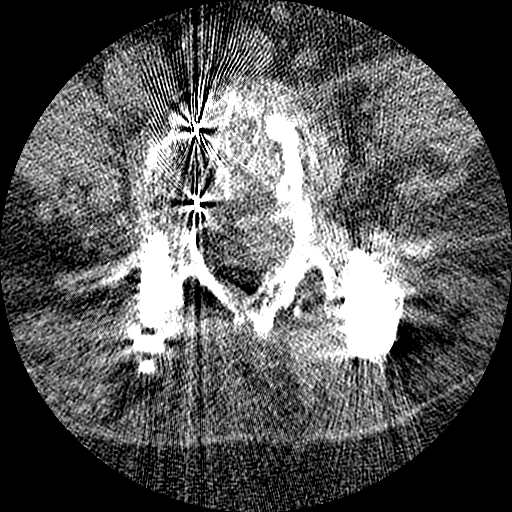
[im 35/104  bone]
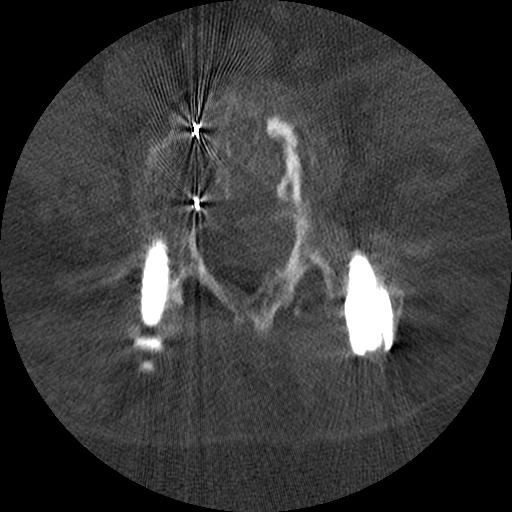
[im 69/104  bone]
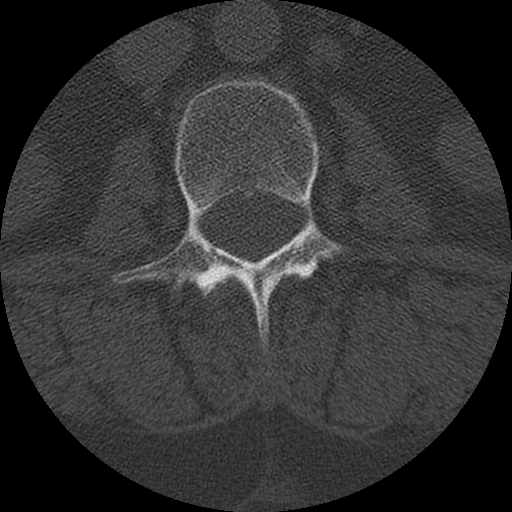

[Series 3: l spine soft · axial · 0.27mm/px · z∈[-229,-144]mm · 2 of 103 slices shown]
[im 35/103  soft-tissue]
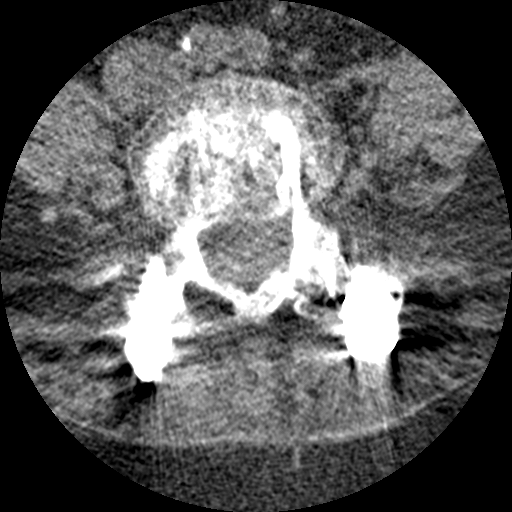
[im 69/103  soft-tissue]
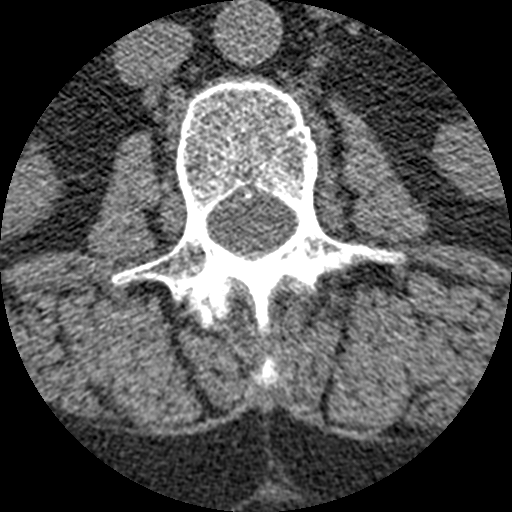

[Series 400: cor upper · coronal · 0.52mm/px · 1 of 58 slices shown]
[im 29/58  bone]
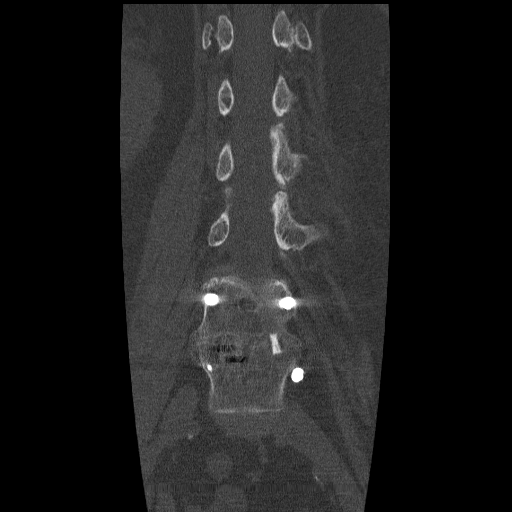

[Series 402: sag · sagittal · 0.52mm/px · 5 of 70 slices shown, 6 images]
[im 24/70  bone]
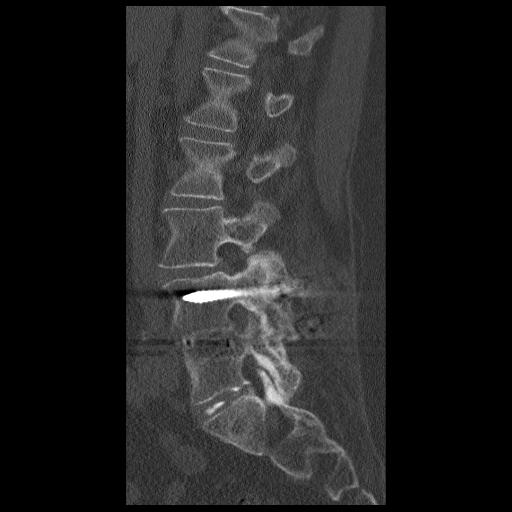
[im 29/70  bone]
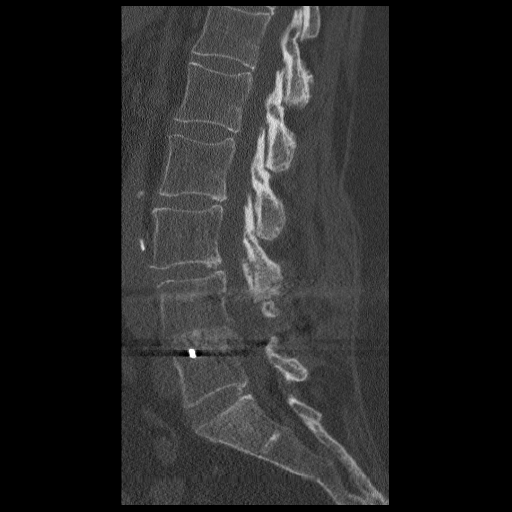
[im 35/70  soft-tissue]
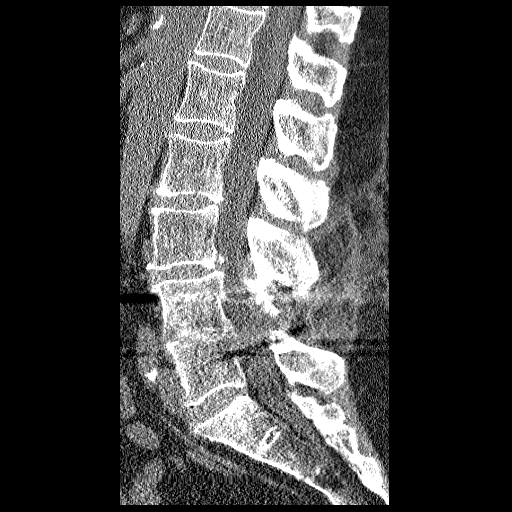
[im 35/70  bone]
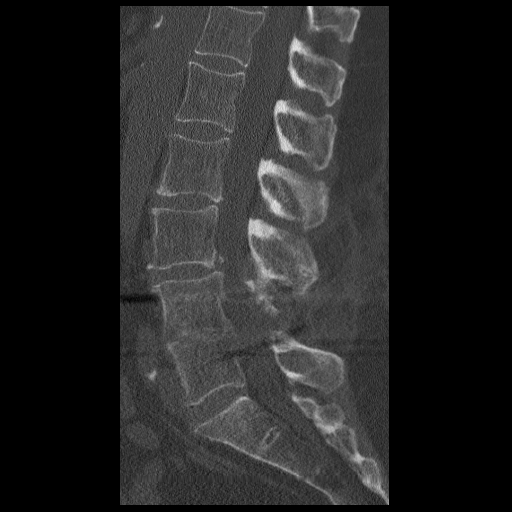
[im 41/70  bone]
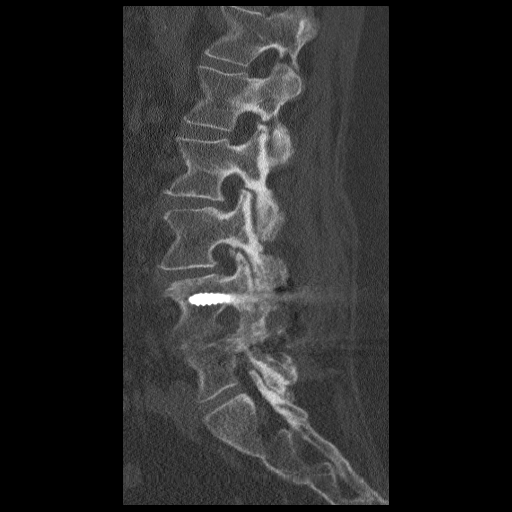
[im 47/70  bone]
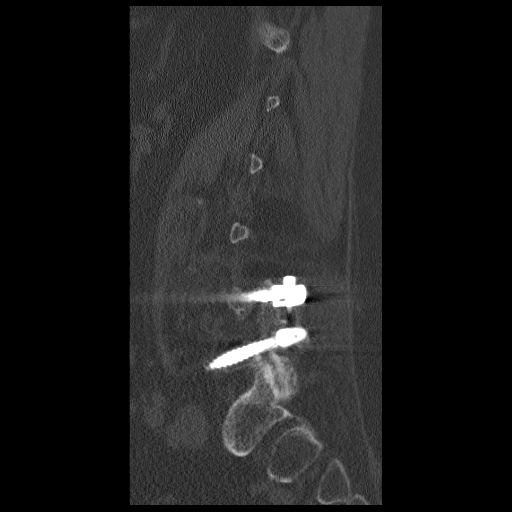

[10 of 33 positions shown; findings below may reference images not displayed]

FINDINGS: Last fully open disk space is labeled L5-S1. Present examination
incorporates from T12 through the S3 level.

Atherosclerotic type changes of the abdominal aorta and iliac
arteries.

T12-L1:  Negative.

L1-2:  Mild facet joint degenerative changes.

L2-3: Moderate facet joint degenerative changes. Mild bulge slightly
greater left lateral position.

L3-4: Prominent facet joint degenerative changes. Ligamentum flavum
hypertrophy. 3 mm anterior slip L3. Moderate bulge. Calcified left
paracentral protrusion. Mild to moderate spinal stenosis greater on
the left. Very mild bilateral foraminal narrowing.

L4-5: Prior laminectomy and fusion. Bilateral pedicle screws in
place with left L5 pedicle screw traversing through the left lateral
margin of the L5 vertebral body. No CT evidence of loosening. Solid
bony fusion across the disc space and involving the facet joints. No
obvious spinal stenosis or foraminal narrowing.

L5-S1: Mild to moderate bilateral facet joint degenerative changes.
No significant spinal stenosis or foraminal narrowing.
IMPRESSION: Prior fusion L4-5 as detailed above. No obvious spinal stenosis or
foraminal narrowing at this level.

Mild progression of degenerative changes L3-4 level with
multifactorial mild to moderate spinal stenosis greater on the left
and very mild bilateral foraminal narrowing.

L2-3 moderate facet joint degenerative changes. Mild bulge slightly
greater left lateral position.

L5-S1 mild to moderate bilateral facet joint degenerative changes.
No significant spinal stenosis or foraminal narrowing.

Atherosclerotic type changes aorta and iliac arteries.

## 2016-02-03 ENCOUNTER — Other Ambulatory Visit: Payer: Self-pay | Admitting: Internal Medicine

## 2016-07-17 ENCOUNTER — Other Ambulatory Visit: Payer: Self-pay | Admitting: Neurosurgery

## 2016-07-17 DIAGNOSIS — M4726 Other spondylosis with radiculopathy, lumbar region: Secondary | ICD-10-CM

## 2016-08-01 ENCOUNTER — Ambulatory Visit
Admission: RE | Admit: 2016-08-01 | Discharge: 2016-08-01 | Disposition: A | Discharge status: Home / Self Care | Payer: Medicare (Managed Care) | Source: Ambulatory Visit | Attending: Neurosurgery | Admitting: Neurosurgery

## 2016-08-01 DIAGNOSIS — M4726 Other spondylosis with radiculopathy, lumbar region: Secondary | ICD-10-CM

## 2016-08-01 MED ORDER — GADOBENATE DIMEGLUMINE 529 MG/ML IV SOLN
20.0000 mL | Freq: Once | INTRAVENOUS | Status: AC | PRN
  Administered 2016-08-01: 20 mL via INTRAVENOUS

## 2017-06-18 ENCOUNTER — Other Ambulatory Visit: Payer: Self-pay | Admitting: Physician Assistant

## 2017-06-18 DIAGNOSIS — E2839 Other primary ovarian failure: Secondary | ICD-10-CM

## 2017-06-18 DIAGNOSIS — Z1231 Encounter for screening mammogram for malignant neoplasm of breast: Secondary | ICD-10-CM

## 2017-07-17 ENCOUNTER — Other Ambulatory Visit: Payer: Medicare (Managed Care)

## 2017-07-17 ENCOUNTER — Ambulatory Visit: Payer: Medicare (Managed Care)

## 2017-08-15 ENCOUNTER — Inpatient Hospital Stay
Admission: RE | Admit: 2017-08-15 | Discharge: 2017-08-15 | Disposition: A | Discharge status: Home / Self Care | Payer: Medicare (Managed Care) | Source: Ambulatory Visit | Attending: Physician Assistant | Admitting: Physician Assistant

## 2017-08-15 ENCOUNTER — Ambulatory Visit: Payer: Medicare (Managed Care)

## 2018-05-09 DIAGNOSIS — J309 Allergic rhinitis, unspecified: Secondary | ICD-10-CM | POA: Diagnosis not present

## 2018-05-09 DIAGNOSIS — J449 Chronic obstructive pulmonary disease, unspecified: Secondary | ICD-10-CM | POA: Diagnosis not present

## 2018-07-28 DIAGNOSIS — Z139 Encounter for screening, unspecified: Secondary | ICD-10-CM | POA: Diagnosis not present

## 2018-07-28 DIAGNOSIS — Z9181 History of falling: Secondary | ICD-10-CM | POA: Diagnosis not present

## 2018-07-28 DIAGNOSIS — Z0000 Encounter for general adult medical examination without abnormal findings: Secondary | ICD-10-CM | POA: Diagnosis not present

## 2018-07-28 DIAGNOSIS — Z136 Encounter for screening for cardiovascular disorders: Secondary | ICD-10-CM | POA: Diagnosis not present

## 2018-07-28 DIAGNOSIS — Z23 Encounter for immunization: Secondary | ICD-10-CM | POA: Diagnosis not present

## 2018-07-28 DIAGNOSIS — Z1239 Encounter for other screening for malignant neoplasm of breast: Secondary | ICD-10-CM | POA: Diagnosis not present

## 2018-07-30 ENCOUNTER — Other Ambulatory Visit: Payer: Self-pay | Admitting: Physician Assistant

## 2018-07-30 DIAGNOSIS — Z1231 Encounter for screening mammogram for malignant neoplasm of breast: Secondary | ICD-10-CM

## 2018-07-30 DIAGNOSIS — E2839 Other primary ovarian failure: Secondary | ICD-10-CM

## 2018-08-12 DIAGNOSIS — E039 Hypothyroidism, unspecified: Secondary | ICD-10-CM | POA: Diagnosis not present

## 2018-08-12 DIAGNOSIS — E7800 Pure hypercholesterolemia, unspecified: Secondary | ICD-10-CM | POA: Diagnosis not present

## 2018-08-12 DIAGNOSIS — E119 Type 2 diabetes mellitus without complications: Secondary | ICD-10-CM | POA: Diagnosis not present

## 2018-08-12 DIAGNOSIS — I2510 Atherosclerotic heart disease of native coronary artery without angina pectoris: Secondary | ICD-10-CM | POA: Diagnosis not present

## 2018-08-12 DIAGNOSIS — I10 Essential (primary) hypertension: Secondary | ICD-10-CM | POA: Diagnosis not present

## 2018-08-12 DIAGNOSIS — I509 Heart failure, unspecified: Secondary | ICD-10-CM | POA: Diagnosis not present

## 2018-09-04 DIAGNOSIS — R300 Dysuria: Secondary | ICD-10-CM | POA: Diagnosis not present

## 2018-09-22 DIAGNOSIS — N289 Disorder of kidney and ureter, unspecified: Secondary | ICD-10-CM | POA: Diagnosis not present

## 2018-09-26 ENCOUNTER — Ambulatory Visit
Admission: RE | Admit: 2018-09-26 | Discharge: 2018-09-26 | Disposition: A | Discharge status: Home / Self Care | Payer: Medicare Other | Source: Ambulatory Visit | Attending: Physician Assistant | Admitting: Physician Assistant

## 2018-09-26 ENCOUNTER — Ambulatory Visit
Admission: RE | Admit: 2018-09-26 | Discharge: 2018-09-26 | Disposition: A | Discharge status: Home / Self Care | Payer: Medicare (Managed Care) | Source: Ambulatory Visit | Attending: Physician Assistant | Admitting: Physician Assistant

## 2018-09-26 DIAGNOSIS — E2839 Other primary ovarian failure: Secondary | ICD-10-CM

## 2018-09-26 DIAGNOSIS — Z780 Asymptomatic menopausal state: Secondary | ICD-10-CM | POA: Diagnosis not present

## 2018-09-26 DIAGNOSIS — M8589 Other specified disorders of bone density and structure, multiple sites: Secondary | ICD-10-CM | POA: Diagnosis not present

## 2018-09-26 DIAGNOSIS — Z1231 Encounter for screening mammogram for malignant neoplasm of breast: Secondary | ICD-10-CM | POA: Diagnosis not present

## 2018-10-08 DIAGNOSIS — I10 Essential (primary) hypertension: Secondary | ICD-10-CM | POA: Diagnosis not present

## 2018-10-08 DIAGNOSIS — M961 Postlaminectomy syndrome, not elsewhere classified: Secondary | ICD-10-CM | POA: Diagnosis not present

## 2019-02-24 DIAGNOSIS — E039 Hypothyroidism, unspecified: Secondary | ICD-10-CM | POA: Diagnosis not present

## 2019-02-24 DIAGNOSIS — I2510 Atherosclerotic heart disease of native coronary artery without angina pectoris: Secondary | ICD-10-CM | POA: Diagnosis not present

## 2019-02-24 DIAGNOSIS — E119 Type 2 diabetes mellitus without complications: Secondary | ICD-10-CM | POA: Diagnosis not present

## 2019-02-24 DIAGNOSIS — E7800 Pure hypercholesterolemia, unspecified: Secondary | ICD-10-CM | POA: Diagnosis not present

## 2019-02-24 DIAGNOSIS — I509 Heart failure, unspecified: Secondary | ICD-10-CM | POA: Diagnosis not present

## 2019-02-24 DIAGNOSIS — I10 Essential (primary) hypertension: Secondary | ICD-10-CM | POA: Diagnosis not present

## 2019-03-18 DIAGNOSIS — M961 Postlaminectomy syndrome, not elsewhere classified: Secondary | ICD-10-CM | POA: Diagnosis not present

## 2019-06-24 DIAGNOSIS — I2510 Atherosclerotic heart disease of native coronary artery without angina pectoris: Secondary | ICD-10-CM | POA: Diagnosis not present

## 2019-06-24 DIAGNOSIS — E119 Type 2 diabetes mellitus without complications: Secondary | ICD-10-CM | POA: Diagnosis not present

## 2019-06-24 DIAGNOSIS — I509 Heart failure, unspecified: Secondary | ICD-10-CM | POA: Diagnosis not present

## 2019-06-24 DIAGNOSIS — E039 Hypothyroidism, unspecified: Secondary | ICD-10-CM | POA: Diagnosis not present

## 2019-06-24 DIAGNOSIS — I10 Essential (primary) hypertension: Secondary | ICD-10-CM | POA: Diagnosis not present

## 2019-06-24 DIAGNOSIS — E7800 Pure hypercholesterolemia, unspecified: Secondary | ICD-10-CM | POA: Diagnosis not present

## 2019-06-24 DIAGNOSIS — Z23 Encounter for immunization: Secondary | ICD-10-CM | POA: Diagnosis not present

## 2019-07-30 DIAGNOSIS — M961 Postlaminectomy syndrome, not elsewhere classified: Secondary | ICD-10-CM | POA: Diagnosis not present

## 2019-08-03 DIAGNOSIS — E785 Hyperlipidemia, unspecified: Secondary | ICD-10-CM | POA: Diagnosis not present

## 2019-08-03 DIAGNOSIS — Z139 Encounter for screening, unspecified: Secondary | ICD-10-CM | POA: Diagnosis not present

## 2019-08-03 DIAGNOSIS — Z9181 History of falling: Secondary | ICD-10-CM | POA: Diagnosis not present

## 2019-08-03 DIAGNOSIS — Z0000 Encounter for general adult medical examination without abnormal findings: Secondary | ICD-10-CM | POA: Diagnosis not present

## 2019-10-07 DIAGNOSIS — H26493 Other secondary cataract, bilateral: Secondary | ICD-10-CM | POA: Diagnosis not present

## 2019-10-21 DIAGNOSIS — H26493 Other secondary cataract, bilateral: Secondary | ICD-10-CM | POA: Diagnosis not present

## 2019-11-25 DIAGNOSIS — E119 Type 2 diabetes mellitus without complications: Secondary | ICD-10-CM | POA: Diagnosis not present

## 2019-11-25 DIAGNOSIS — E7800 Pure hypercholesterolemia, unspecified: Secondary | ICD-10-CM | POA: Diagnosis not present

## 2019-11-25 DIAGNOSIS — I509 Heart failure, unspecified: Secondary | ICD-10-CM | POA: Diagnosis not present

## 2019-11-25 DIAGNOSIS — I10 Essential (primary) hypertension: Secondary | ICD-10-CM | POA: Diagnosis not present

## 2019-11-25 DIAGNOSIS — E039 Hypothyroidism, unspecified: Secondary | ICD-10-CM | POA: Diagnosis not present

## 2019-11-25 DIAGNOSIS — I2510 Atherosclerotic heart disease of native coronary artery without angina pectoris: Secondary | ICD-10-CM | POA: Diagnosis not present

## 2019-11-29 DIAGNOSIS — R110 Nausea: Secondary | ICD-10-CM | POA: Diagnosis not present

## 2019-11-29 DIAGNOSIS — R531 Weakness: Secondary | ICD-10-CM | POA: Diagnosis not present

## 2019-11-29 DIAGNOSIS — R42 Dizziness and giddiness: Secondary | ICD-10-CM | POA: Diagnosis not present

## 2019-11-29 DIAGNOSIS — R0989 Other specified symptoms and signs involving the circulatory and respiratory systems: Secondary | ICD-10-CM | POA: Diagnosis not present

## 2019-12-03 DIAGNOSIS — I10 Essential (primary) hypertension: Secondary | ICD-10-CM | POA: Diagnosis not present

## 2019-12-03 DIAGNOSIS — M961 Postlaminectomy syndrome, not elsewhere classified: Secondary | ICD-10-CM | POA: Diagnosis not present

## 2020-02-11 DIAGNOSIS — R030 Elevated blood-pressure reading, without diagnosis of hypertension: Secondary | ICD-10-CM | POA: Diagnosis not present

## 2020-02-11 DIAGNOSIS — R300 Dysuria: Secondary | ICD-10-CM | POA: Diagnosis not present

## 2020-02-25 DIAGNOSIS — E119 Type 2 diabetes mellitus without complications: Secondary | ICD-10-CM

## 2020-02-25 HISTORY — DX: Type 2 diabetes mellitus without complications: E11.9

## 2020-03-24 DIAGNOSIS — M170 Bilateral primary osteoarthritis of knee: Secondary | ICD-10-CM | POA: Diagnosis not present

## 2020-03-25 DIAGNOSIS — M1711 Unilateral primary osteoarthritis, right knee: Secondary | ICD-10-CM | POA: Diagnosis not present

## 2020-03-25 DIAGNOSIS — E7800 Pure hypercholesterolemia, unspecified: Secondary | ICD-10-CM | POA: Diagnosis not present

## 2020-03-25 DIAGNOSIS — E119 Type 2 diabetes mellitus without complications: Secondary | ICD-10-CM | POA: Diagnosis not present

## 2020-03-25 DIAGNOSIS — Z01818 Encounter for other preprocedural examination: Secondary | ICD-10-CM | POA: Diagnosis not present

## 2020-03-25 DIAGNOSIS — I10 Essential (primary) hypertension: Secondary | ICD-10-CM | POA: Diagnosis not present

## 2020-03-25 DIAGNOSIS — I2510 Atherosclerotic heart disease of native coronary artery without angina pectoris: Secondary | ICD-10-CM | POA: Diagnosis not present

## 2020-03-25 DIAGNOSIS — E039 Hypothyroidism, unspecified: Secondary | ICD-10-CM | POA: Diagnosis not present

## 2020-03-25 DIAGNOSIS — I509 Heart failure, unspecified: Secondary | ICD-10-CM | POA: Diagnosis not present

## 2020-04-07 DIAGNOSIS — M961 Postlaminectomy syndrome, not elsewhere classified: Secondary | ICD-10-CM | POA: Diagnosis not present

## 2020-05-12 DIAGNOSIS — E559 Vitamin D deficiency, unspecified: Secondary | ICD-10-CM | POA: Diagnosis not present

## 2020-05-12 DIAGNOSIS — E7800 Pure hypercholesterolemia, unspecified: Secondary | ICD-10-CM | POA: Diagnosis not present

## 2020-05-12 DIAGNOSIS — Z79899 Other long term (current) drug therapy: Secondary | ICD-10-CM | POA: Diagnosis not present

## 2020-05-12 DIAGNOSIS — Z20822 Contact with and (suspected) exposure to covid-19: Secondary | ICD-10-CM | POA: Diagnosis not present

## 2020-05-12 DIAGNOSIS — E119 Type 2 diabetes mellitus without complications: Secondary | ICD-10-CM | POA: Diagnosis not present

## 2020-05-12 DIAGNOSIS — R0602 Shortness of breath: Secondary | ICD-10-CM | POA: Diagnosis not present

## 2020-05-12 DIAGNOSIS — E039 Hypothyroidism, unspecified: Secondary | ICD-10-CM | POA: Diagnosis not present

## 2020-05-24 DIAGNOSIS — M25552 Pain in left hip: Secondary | ICD-10-CM | POA: Diagnosis not present

## 2020-05-24 DIAGNOSIS — E119 Type 2 diabetes mellitus without complications: Secondary | ICD-10-CM | POA: Diagnosis not present

## 2020-05-24 DIAGNOSIS — E559 Vitamin D deficiency, unspecified: Secondary | ICD-10-CM | POA: Diagnosis not present

## 2020-05-24 DIAGNOSIS — E039 Hypothyroidism, unspecified: Secondary | ICD-10-CM | POA: Diagnosis not present

## 2020-07-05 DIAGNOSIS — I2510 Atherosclerotic heart disease of native coronary artery without angina pectoris: Secondary | ICD-10-CM | POA: Diagnosis not present

## 2020-07-05 DIAGNOSIS — I10 Essential (primary) hypertension: Secondary | ICD-10-CM | POA: Diagnosis not present

## 2020-07-05 DIAGNOSIS — E119 Type 2 diabetes mellitus without complications: Secondary | ICD-10-CM | POA: Diagnosis not present

## 2020-07-05 DIAGNOSIS — M1711 Unilateral primary osteoarthritis, right knee: Secondary | ICD-10-CM | POA: Diagnosis not present

## 2020-07-07 DIAGNOSIS — M961 Postlaminectomy syndrome, not elsewhere classified: Secondary | ICD-10-CM | POA: Diagnosis not present

## 2020-07-11 DIAGNOSIS — M25552 Pain in left hip: Secondary | ICD-10-CM | POA: Diagnosis not present

## 2020-07-11 DIAGNOSIS — M545 Low back pain, unspecified: Secondary | ICD-10-CM | POA: Diagnosis not present

## 2020-07-18 ENCOUNTER — Ambulatory Visit: Payer: Self-pay | Admitting: Physician Assistant

## 2020-07-18 DIAGNOSIS — M1711 Unilateral primary osteoarthritis, right knee: Secondary | ICD-10-CM | POA: Diagnosis not present
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
## 2020-08-01 NOTE — Patient Instructions (Addendum)
DUE TO COVID-19 ONLY ONE VISITOR IS ALLOWED TO COME WITH YOU AND STAY IN THE WAITING ROOM ONLY DURING PRE OP AND PROCEDURE DAY OF SURGERY. THE 1 VISITOR  MAY VISIT WITH YOU AFTER SURGERY IN YOUR PRIVATE ROOM DURING VISITING HOURS ONLY!  YOU NEED TO HAVE A COVID 19 TEST ON_12/14______ @_10 :30______, THIS TEST MUST BE DONE BEFORE SURGERY,  COVID TESTING SITE [address] [place] , IT IS ON THE RIGHT GOING OUT [address] APPROXIMATELY  2 MINUTES PAST [employer] ON THE RIGHT. ONCE YOUR COVID TEST IS COMPLETED,  PLEASE BEGIN THE QUARANTINE INSTRUCTIONS AS OUTLINED IN YOUR HANDOUT.                [id number]    Your procedure is scheduled on: 08/12/20   Report to [facility] Main  Entrance   Report to Short Stay at 5:30 AM     Call this number if you have problems the morning of surgery [phone]    BRUSH YOUR TEETH MORNING OF SURGERY AND RINSE YOUR MOUTH OUT, NO CHEWING GUM CANDY OR MINTS.   No food after midnight.    You may have clear liquid until 4:30 AM.    At 4:00 AM drink pre surgery drink  . Nothing by mouth after 4:30 AM.    Take these medicines the morning of surgery with A SIP OF WATER: Metoprolol, Levothyroxine, Omeprazole,  Use your eye drops as usual. Use your inhalers and bring them with you to the hospital  DO NOT TAKE ANY DIABETIC MEDICATIONS DAY OF YOUR SURGERY How to Manage Your Diabetes Before and After Surgery  Why is it important to control my blood sugar before and after surgery? . Improving blood sugar levels before and after surgery helps healing and can limit problems. . A way of improving blood sugar control is eating a healthy diet by: o  Eating less sugar and carbohydrates o  Increasing activity/exercise o  Talking with your doctor about reaching your blood sugar goals . High blood sugars (greater than 180 mg/dL) can raise your risk of infections and slow your recovery, so you will need to focus on  controlling your diabetes during the weeks before surgery. . Make sure that the doctor who takes care of your diabetes knows about your planned surgery including the date and location.  How do I manage my blood sugar before surgery? . Check your blood sugar at least 4 times a day, starting 2 days before surgery, to make sure that the level is not too high or low. o Check your blood sugar the morning of your surgery when you wake up and every 2 hours until you get to the Short Stay unit. . If your blood sugar is less than 70 mg/dL, you will need to treat for low blood sugar: o Do not take insulin. o Treat a low blood sugar (less than 70 mg/dL) with  cup of clear juice (cranberry or apple), 4 glucose tablets, OR glucose gel. o Recheck blood sugar in 15 minutes after treatment (to make sure it is greater than 70 mg/dL). If your blood sugar is not greater than 70 mg/dL on recheck, call 08-06-1998 for further instructions. . Report your blood sugar to the short stay nurse when you get to Short Stay.  . If you are admitted to the hospital after surgery: o Your blood sugar will be checked by the staff and you will probably be given insulin after surgery (instead  of oral diabetes medicines) to make sure you have good blood sugar levels. o The goal for blood sugar control after surgery is 80-180 mg/dL.   WHAT DO I DO ABOUT MY DIABETES MEDICATION?  Marland Kitchen Do not take oral diabetes medicines (pills) the morning of surgery.                                   You may not have any metal on your body including hair pins and              piercings  Do not wear jewelry, make-up, lotions, powders or perfumes, deodorant             Do not wear nail polish on your fingernails.  Do not shave  48 hours prior to surgery.                Do not bring valuables to the hospital. [facility] IS NOT             RESPONSIBLE   FOR VALUABLES.  Contacts, dentures or bridgework may not be worn into  surgery.       Special Instructions: N/A              Please read over the following fact sheets you were given: _____________________________________________________________________             [facility] - Preparing for Surgery Before surgery, you can play an important role.   Because skin is not sterile, your skin needs to be as free of germs as possible.   You can reduce the number of germs on your skin by washing with CHG (chlorahexidine gluconate) soap before surgery.   CHG is an antiseptic cleaner which kills germs and bonds with the skin to continue killing germs even after washing. Please DO NOT use if you have an allergy to CHG or antibacterial soaps.   If your skin becomes reddened/irritated stop using the CHG and inform your nurse when you arrive at Short Stay. Do not shave (including legs and underarms) for at least 48 hours prior to the first CHG shower.   Please follow these instructions carefully:  1.  Shower with CHG Soap the night before surgery and the  morning of Surgery.  2.  If you choose to wash your hair, wash your hair first as usual with your  normal  shampoo.  3.  After you shampoo, rinse your hair and body thoroughly to remove the  shampoo.                                        4.  Use CHG as you would any other liquid soap.  You can apply chg directly  to the skin and wash                       Gently with a scrungie or clean washcloth.  5.  Apply the CHG Soap to your body ONLY FROM THE NECK DOWN.   Do not use on face/ open                           Wound or open sores. Avoid contact with eyes, ears mouth and genitals (private parts).  Wash face,  Genitals (private parts) with your normal soap.             6.  Wash thoroughly, paying special attention to the area where your surgery  will be performed.  7.  Thoroughly rinse your body with warm water from the neck down.  8.  DO NOT shower/wash with your normal soap after using and  rinsing off  the CHG Soap.             9.  Pat yourself dry with a clean towel.            10.  Wear clean pajamas.            11.  Place clean sheets on your bed the night of your first shower and do not  sleep with pets. Day of Surgery : Do not apply any lotions/deodorants the morning of surgery.  Please wear clean clothes to the hospital/surgery center.  FAILURE TO FOLLOW THESE INSTRUCTIONS MAY RESULT IN THE CANCELLATION OF YOUR SURGERY PATIENT SIGNATURE_________________________________  NURSE SIGNATURE__________________________________  ________________________________________________________________________   Rogelia Mire  An incentive spirometer is a tool that can help keep your lungs clear and active. This tool measures how well you are filling your lungs with each breath. Taking long deep breaths may help reverse or decrease the chance of developing breathing (pulmonary) problems (especially infection) following:  A long period of time when you are unable to move or be active. BEFORE THE PROCEDURE   If the spirometer includes an indicator to show your best effort, your nurse or respiratory therapist will set it to a desired goal.  If possible, sit up straight or lean slightly forward. Try not to slouch.  Hold the incentive spirometer in an upright position. INSTRUCTIONS FOR USE  1. Sit on the edge of your bed if possible, or sit up as far as you can in bed or on a chair. 2. Hold the incentive spirometer in an upright position. 3. Breathe out normally. 4. Place the mouthpiece in your mouth and seal your lips tightly around it. 5. Breathe in slowly and as deeply as possible, raising the piston or the ball toward the top of the column. 6. Hold your breath for 3-5 seconds or for as long as possible. Allow the piston or ball to fall to the bottom of the column. 7. Remove the mouthpiece from your mouth and breathe out normally. 8. Rest for a few seconds and repeat Steps 1  through 7 at least 10 times every 1-2 hours when you are awake. Take your time and take a few normal breaths between deep breaths. 9. The spirometer may include an indicator to show your best effort. Use the indicator as a goal to work toward during each repetition. 10. After each set of 10 deep breaths, practice coughing to be sure your lungs are clear. If you have an incision (the cut made at the time of surgery), support your incision when coughing by placing a pillow or rolled up towels firmly against it. Once you are able to get out of bed, walk around indoors and cough well. You may stop using the incentive spirometer when instructed by your caregiver.  RISKS AND COMPLICATIONS  Take your time so you do not get dizzy or light-headed.  If you are in pain, you may need to take or ask for pain medication before doing incentive spirometry. It is harder to take a deep breath if you are having pain. AFTER USE  Rest and breathe slowly and easily.  It can be helpful to keep track of a log of your progress. Your caregiver can provide you with a simple table to help with this. If you are using the spirometer at home, follow these instructions: [place] IF:   You are having difficultly using the spirometer.  You have trouble using the spirometer as often as instructed.  Your pain medication is not giving enough relief while using the spirometer.  You develop fever of 100.5 F (38.1 C) or higher. SEEK IMMEDIATE MEDICAL CARE IF:   You cough up bloody sputum that had not been present before.  You develop fever of 102 F (38.9 C) or greater.  You develop worsening pain at or near the incision site. MAKE SURE YOU:   Understand these instructions.  Will watch your condition.  Will get help right away if you are not doing well or get worse. Document Released: 12/24/2006 Document Revised: 11/05/2011 Document Reviewed: 02/24/2007 [facility] Patient Information 2014 [place],  [place].   ________________________________________________________________________

## 2020-08-01 NOTE — Care Plan (Signed)
Ortho Bundle Case Management Note  Patient Details  Name: Carolyn Faulkner MRN: [id number] Date of Birth: 1946-05-19   met with patient in the office prior to surgery. She plans to discharge to home with family to assist. CPM ordered, she has a walker. HHPT referral to [facility] and OPPT set up with [facility].  Patient and MD in agreement with plan and choice offered.                   DME Arranged:  [facility] DME Agency:  [employer]  HH Arranged:  PT [place] Agency:  [facility] (formerly [facility])  Additional Comments: Please contact me with any questions of if this plan should need to change.  Ladell Heads,  [facility] [facility]  [phone] 08/01/2020, 2:51 PM

## 2020-08-02 ENCOUNTER — Encounter (HOSPITAL_COMMUNITY): Payer: Self-pay

## 2020-08-02 ENCOUNTER — Encounter (HOSPITAL_COMMUNITY)
Admission: RE | Admit: 2020-08-02 | Discharge: 2020-08-02 | Disposition: A | Discharge status: Home / Self Care | Payer: Medicare Other | Source: Ambulatory Visit | Attending: Orthopedic Surgery | Admitting: Orthopedic Surgery

## 2020-08-02 ENCOUNTER — Other Ambulatory Visit: Payer: Self-pay

## 2020-08-02 DIAGNOSIS — E119 Type 2 diabetes mellitus without complications: Secondary | ICD-10-CM | POA: Diagnosis not present

## 2020-08-02 DIAGNOSIS — Z01818 Encounter for other preprocedural examination: Secondary | ICD-10-CM | POA: Insufficient documentation

## 2020-08-02 DIAGNOSIS — I10 Essential (primary) hypertension: Secondary | ICD-10-CM | POA: Diagnosis not present

## 2020-08-02 LAB — CBC WITH DIFFERENTIAL/PLATELET
Abs Immature Granulocytes: 0.02 10*3/uL (ref 0.00–0.07)
Basophils Absolute: 0 10*3/uL (ref 0.0–0.1)
Basophils Relative: 0 %
Eosinophils Absolute: 0.1 10*3/uL (ref 0.0–0.5)
Eosinophils Relative: 1 %
HCT: 41.1 % (ref 36.0–46.0)
Hemoglobin: 13.8 g/dL (ref 12.0–15.0)
Immature Granulocytes: 0 %
Lymphocytes Relative: 26 %
Lymphs Abs: 2.4 10*3/uL (ref 0.7–4.0)
MCH: 30.1 pg (ref 26.0–34.0)
MCHC: 33.6 g/dL (ref 30.0–36.0)
MCV: 89.5 fL (ref 80.0–100.0)
Monocytes Absolute: 0.6 10*3/uL (ref 0.1–1.0)
Monocytes Relative: 7 %
Neutro Abs: 6.1 10*3/uL (ref 1.7–7.7)
Neutrophils Relative %: 66 %
Platelets: 215 10*3/uL (ref 150–400)
RBC: 4.59 MIL/uL (ref 3.87–5.11)
RDW: 12.4 % (ref 11.5–15.5)
WBC: 9.2 10*3/uL (ref 4.0–10.5)
nRBC: 0 % (ref 0.0–0.2)

## 2020-08-02 LAB — HEMOGLOBIN A1C
Hgb A1c MFr Bld: 6.9 % — ABNORMAL HIGH (ref 4.8–5.6)
Mean Plasma Glucose: 151.33 mg/dL

## 2020-08-02 LAB — URINALYSIS, ROUTINE W REFLEX MICROSCOPIC
Bilirubin Urine: NEGATIVE
Glucose, UA: NEGATIVE mg/dL
Hgb urine dipstick: NEGATIVE
Ketones, ur: NEGATIVE mg/dL
Nitrite: NEGATIVE
Protein, ur: NEGATIVE mg/dL
Specific Gravity, Urine: 1.016 (ref 1.005–1.030)
pH: 7 (ref 5.0–8.0)

## 2020-08-02 LAB — COMPREHENSIVE METABOLIC PANEL
ALT: 25 U/L (ref 0–44)
AST: 17 U/L (ref 15–41)
Albumin: 4.4 g/dL (ref 3.5–5.0)
Alkaline Phosphatase: 63 U/L (ref 38–126)
Anion gap: 12 (ref 5–15)
BUN: 18 mg/dL (ref 8–23)
CO2: 27 mmol/L (ref 22–32)
Calcium: 9.4 mg/dL (ref 8.9–10.3)
Chloride: 101 mmol/L (ref 98–111)
Creatinine, Ser: 0.85 mg/dL (ref 0.44–1.00)
GFR, Estimated: >60 mL/min (ref >60)
Glucose, Bld: 158 mg/dL — ABNORMAL HIGH (ref 70–99)
Potassium: 3.3 mmol/L — ABNORMAL LOW (ref 3.5–5.1)
Sodium: 140 mmol/L (ref 135–145)
Total Bilirubin: 1.1 mg/dL (ref 0.3–1.2)
Total Protein: 7.3 g/dL (ref 6.5–8.1)

## 2020-08-02 LAB — PROTIME-INR
INR: 1 (ref 0.8–1.2)
Prothrombin Time: 13 seconds (ref 11.4–15.2)

## 2020-08-02 LAB — SURGICAL PCR SCREEN
MRSA, PCR: NEGATIVE
Staphylococcus aureus: NEGATIVE

## 2020-08-02 LAB — APTT: aPTT: 26 seconds (ref 24–36)

## 2020-08-02 LAB — GLUCOSE, CAPILLARY: Glucose-Capillary: 169 mg/dL — ABNORMAL HIGH (ref 70–99)

## 2020-08-02 NOTE — Progress Notes (Signed)
COVID Vaccine Completed:Yes Date COVID Vaccine completed:01/2020 COVID vaccine manufacturer:   [name]   PCP - [facility] Cardiologist - Dr. Lovina Reach  Chest x-ray - no EKG - 08/02/20 Stress Test - 2012-Epic ECHO - 2016-Epic Cardiac Cath - 07/30/11-Epic Pacemaker/ICD device last checked:NA  Sleep Study - no CPAP -   Fasting Blood Sugar - doesn't check Checks Blood Sugar _____ times a day  Blood Thinner Instructions:ASA/ [place] Aspirin Instructions:Stop 7 days prior/[place] Last Dose:08/04/20  Anesthesia review:   Patient denies shortness of breath, fever, cough and chest pain at PAT appointment yes  Patient verbalized understanding of instructions that were given to them at the PAT appointment. Patient was also instructed that they will need to review over the PAT instructions again at home before surgery.Yes Pt is able to climb 1 flight of stairs and she sometimes gets SOB doing housework, walking and with ADLs

## 2020-08-03 LAB — URINE CULTURE

## 2020-08-09 ENCOUNTER — Other Ambulatory Visit (HOSPITAL_COMMUNITY)
Admission: RE | Admit: 2020-08-09 | Discharge: 2020-08-09 | Disposition: A | Discharge status: Home / Self Care | Payer: Medicare Other | Source: Ambulatory Visit | Attending: Orthopedic Surgery | Admitting: Orthopedic Surgery

## 2020-08-09 DIAGNOSIS — Z01812 Encounter for preprocedural laboratory examination: Secondary | ICD-10-CM | POA: Insufficient documentation

## 2020-08-09 DIAGNOSIS — Z20822 Contact with and (suspected) exposure to covid-19: Secondary | ICD-10-CM | POA: Diagnosis not present

## 2020-08-09 LAB — SARS CORONAVIRUS 2 (TAT 6-24 HRS): SARS Coronavirus 2: NEGATIVE

## 2020-08-11 MED ORDER — BUPIVACAINE LIPOSOME 1.3 % IJ SUSP
20.0000 mL | INTRAMUSCULAR | Status: DC
  Filled 2020-08-11: qty 20

## 2020-08-11 MED ORDER — TRANEXAMIC ACID 1000 MG/10ML IV SOLN
2000.0000 mg | INTRAVENOUS | Status: DC
  Filled 2020-08-11: qty 20

## 2020-08-11 NOTE — Anesthesia Preprocedure Evaluation (Addendum)
Anesthesia Evaluation  Patient identified by MRN, date of birth, ID band Patient awake    Reviewed: Allergy & Precautions, NPO status , Patient's Chart, lab work & pertinent test results, reviewed documented beta blocker date and time   Airway Mallampati: II  TM Distance: >3 FB Neck ROM: Full    Dental  (+) Missing   Pulmonary COPD,  COPD inhaler, former smoker,    Pulmonary exam normal breath sounds clear to auscultation       Cardiovascular hypertension, Pt. on medications and Pt. on home beta blockers + Past MI and +CHF  Normal cardiovascular exam Rhythm:Regular Rate:Normal     Neuro/Psych negative neurological ROS     GI/Hepatic Neg liver ROS, GERD  Medicated,  Endo/Other  diabetes, Type 2, Oral Hypoglycemic AgentsHypothyroidism Obesity   Renal/GU negative Renal ROS     Musculoskeletal  (+) Arthritis , Osteoarthritis,    Abdominal   Peds  Hematology negative hematology ROS (+) Plt 215k   Anesthesia Other Findings   Reproductive/Obstetrics                            Anesthesia Physical Anesthesia Plan  ASA: III  Anesthesia Plan: Spinal   Post-op Pain Management:  Regional for Post-op pain   Induction: Intravenous  PONV Risk Score and Plan: 2 and Propofol infusion, Dexamethasone and Ondansetron  Airway Management Planned: Natural Airway and Nasal Cannula  Additional Equipment:   Intra-op Plan:   Post-operative Plan:   Informed Consent: I have reviewed the patients History and Physical, chart, labs and discussed the procedure including the risks, benefits and alternatives for the proposed anesthesia with the patient or authorized representative who has indicated his/her understanding and acceptance.       Plan Discussed with: CRNA  Anesthesia Plan Comments: (Discuss previous back operations in AM.)       Anesthesia Quick Evaluation

## 2020-08-12 ENCOUNTER — Ambulatory Visit (HOSPITAL_COMMUNITY)
Admission: RE | Admit: 2020-08-12 | Discharge: 2020-08-13 | Disposition: A | Discharge status: Home Health | Payer: Medicare Other | Attending: Orthopedic Surgery | Admitting: Orthopedic Surgery

## 2020-08-12 ENCOUNTER — Encounter (HOSPITAL_COMMUNITY): Payer: Self-pay | Admitting: Orthopedic Surgery

## 2020-08-12 ENCOUNTER — Ambulatory Visit (HOSPITAL_COMMUNITY): Payer: Medicare Other | Admitting: Physician Assistant

## 2020-08-12 ENCOUNTER — Other Ambulatory Visit: Payer: Self-pay

## 2020-08-12 ENCOUNTER — Encounter (HOSPITAL_COMMUNITY)
Admission: RE | Disposition: A | Discharge status: Home Health | Payer: Self-pay | Source: Home / Self Care | Attending: Orthopedic Surgery

## 2020-08-12 ENCOUNTER — Ambulatory Visit (HOSPITAL_COMMUNITY): Payer: Medicare Other | Admitting: Certified Registered Nurse Anesthetist

## 2020-08-12 DIAGNOSIS — Z7982 Long term (current) use of aspirin: Secondary | ICD-10-CM | POA: Insufficient documentation

## 2020-08-12 DIAGNOSIS — Z888 Allergy status to other drugs, medicaments and biological substances status: Secondary | ICD-10-CM | POA: Insufficient documentation

## 2020-08-12 DIAGNOSIS — Z79890 Hormone replacement therapy (postmenopausal): Secondary | ICD-10-CM | POA: Insufficient documentation

## 2020-08-12 DIAGNOSIS — M21161 Varus deformity, not elsewhere classified, right knee: Secondary | ICD-10-CM | POA: Insufficient documentation

## 2020-08-12 DIAGNOSIS — J449 Chronic obstructive pulmonary disease, unspecified: Secondary | ICD-10-CM | POA: Insufficient documentation

## 2020-08-12 DIAGNOSIS — Z885 Allergy status to narcotic agent status: Secondary | ICD-10-CM | POA: Insufficient documentation

## 2020-08-12 DIAGNOSIS — I252 Old myocardial infarction: Secondary | ICD-10-CM | POA: Insufficient documentation

## 2020-08-12 DIAGNOSIS — E669 Obesity, unspecified: Secondary | ICD-10-CM | POA: Insufficient documentation

## 2020-08-12 DIAGNOSIS — Z79899 Other long term (current) drug therapy: Secondary | ICD-10-CM | POA: Diagnosis not present

## 2020-08-12 DIAGNOSIS — M24561 Contracture, right knee: Secondary | ICD-10-CM | POA: Insufficient documentation

## 2020-08-12 DIAGNOSIS — M1711 Unilateral primary osteoarthritis, right knee: Secondary | ICD-10-CM | POA: Diagnosis not present

## 2020-08-12 DIAGNOSIS — M24562 Contracture, left knee: Secondary | ICD-10-CM | POA: Diagnosis not present

## 2020-08-12 DIAGNOSIS — Z87891 Personal history of nicotine dependence: Secondary | ICD-10-CM | POA: Diagnosis not present

## 2020-08-12 DIAGNOSIS — M25761 Osteophyte, right knee: Secondary | ICD-10-CM | POA: Insufficient documentation

## 2020-08-12 DIAGNOSIS — E119 Type 2 diabetes mellitus without complications: Secondary | ICD-10-CM | POA: Insufficient documentation

## 2020-08-12 DIAGNOSIS — G8929 Other chronic pain: Secondary | ICD-10-CM | POA: Diagnosis not present

## 2020-08-12 DIAGNOSIS — G8918 Other acute postprocedural pain: Secondary | ICD-10-CM | POA: Diagnosis not present

## 2020-08-12 HISTORY — PX: TOTAL KNEE ARTHROPLASTY: SHX125

## 2020-08-12 LAB — TYPE AND SCREEN
ABO/RH(D): O POS
Antibody Screen: NEGATIVE

## 2020-08-12 LAB — GLUCOSE, CAPILLARY
Glucose-Capillary: 142 mg/dL — ABNORMAL HIGH (ref 70–99)
Glucose-Capillary: 187 mg/dL — ABNORMAL HIGH (ref 70–99)
Glucose-Capillary: 193 mg/dL — ABNORMAL HIGH (ref 70–99)
Glucose-Capillary: 264 mg/dL — ABNORMAL HIGH (ref 70–99)
Glucose-Capillary: 301 mg/dL — ABNORMAL HIGH (ref 70–99)

## 2020-08-12 SURGERY — ARTHROPLASTY, KNEE, TOTAL
Anesthesia: Spinal | Site: Knee | Laterality: Right

## 2020-08-12 MED ORDER — BUPIVACAINE IN DEXTROSE 0.75-8.25 % IT SOLN
INTRATHECAL | Status: DC | PRN
Start: 2020-08-12 — End: 2020-08-12
  Administered 2020-08-12: 1.8 mL via INTRATHECAL

## 2020-08-12 MED ORDER — CEFAZOLIN SODIUM-DEXTROSE 2-4 GM/100ML-% IV SOLN
2.0000 g | INTRAVENOUS | Status: AC
  Administered 2020-08-12: 08:00:00 2 g via INTRAVENOUS
  Filled 2020-08-12: qty 100

## 2020-08-12 MED ORDER — PHENYLEPHRINE HCL (PRESSORS) 10 MG/ML IV SOLN
INTRAVENOUS | Status: AC
  Filled 2020-08-12: qty 1

## 2020-08-12 MED ORDER — LACTATED RINGERS IV SOLN: INTRAVENOUS | Status: DC

## 2020-08-12 MED ORDER — ONDANSETRON HCL 4 MG/2ML IJ SOLN
INTRAMUSCULAR | Status: AC
  Filled 2020-08-12: qty 2

## 2020-08-12 MED ORDER — WATER FOR IRRIGATION, STERILE IR SOLN
Status: DC | PRN
  Administered 2020-08-12: 1000 mL

## 2020-08-12 MED ORDER — TRANEXAMIC ACID-NACL 1000-0.7 MG/100ML-% IV SOLN
1000.0000 mg | Freq: Once | INTRAVENOUS | Status: AC
  Administered 2020-08-12: 13:00:00 1000 mg via INTRAVENOUS
  Filled 2020-08-12: qty 100

## 2020-08-12 MED ORDER — CEFAZOLIN SODIUM-DEXTROSE 1-4 GM/50ML-% IV SOLN
1.0000 g | Freq: Four times a day (QID) | INTRAVENOUS | Status: AC
  Administered 2020-08-12 (×2): 1 g via INTRAVENOUS
  Filled 2020-08-12 (×2): qty 50

## 2020-08-12 MED ORDER — SODIUM CHLORIDE 0.9 % IR SOLN
Status: DC | PRN
  Administered 2020-08-12: 1000 mL

## 2020-08-12 MED ORDER — ONDANSETRON HCL 4 MG PO TABS: 4.0000 mg | ORAL_TABLET | Freq: Four times a day (QID) | ORAL | Status: DC | PRN

## 2020-08-12 MED ORDER — FUROSEMIDE 40 MG PO TABS
40.0000 mg | ORAL_TABLET | Freq: Every day | ORAL | Status: DC
  Administered 2020-08-12 – 2020-08-13 (×2): 40 mg via ORAL
  Filled 2020-08-12 (×2): qty 1

## 2020-08-12 MED ORDER — METOCLOPRAMIDE HCL 5 MG PO TABS: 5.0000 mg | ORAL_TABLET | Freq: Three times a day (TID) | ORAL | Status: DC | PRN

## 2020-08-12 MED ORDER — ACETAMINOPHEN 500 MG PO TABS: 1000.0000 mg | ORAL_TABLET | Freq: Once | ORAL | Status: AC

## 2020-08-12 MED ORDER — METOCLOPRAMIDE HCL 5 MG/ML IJ SOLN: 5.0000 mg | Freq: Three times a day (TID) | INTRAMUSCULAR | Status: DC | PRN

## 2020-08-12 MED ORDER — ACETAMINOPHEN 325 MG PO TABS
325.0000 mg | ORAL_TABLET | Freq: Four times a day (QID) | ORAL | Status: DC | PRN
  Administered 2020-08-12 – 2020-08-13 (×2): 650 mg via ORAL
  Filled 2020-08-12 (×2): qty 2

## 2020-08-12 MED ORDER — HYDROCHLOROTHIAZIDE 25 MG PO TABS
25.0000 mg | ORAL_TABLET | Freq: Every day | ORAL | Status: DC
  Administered 2020-08-12 – 2020-08-13 (×2): 25 mg via ORAL
  Filled 2020-08-12: qty 1

## 2020-08-12 MED ORDER — ROPIVACAINE HCL 7.5 MG/ML IJ SOLN
INTRAMUSCULAR | Status: DC | PRN
  Administered 2020-08-12: 20 mL via PERINEURAL

## 2020-08-12 MED ORDER — DEXAMETHASONE SODIUM PHOSPHATE 10 MG/ML IJ SOLN
INTRAMUSCULAR | Status: AC
  Filled 2020-08-12: qty 1

## 2020-08-12 MED ORDER — PANTOPRAZOLE SODIUM 40 MG PO TBEC
40.0000 mg | DELAYED_RELEASE_TABLET | Freq: Every day | ORAL | Status: DC
  Administered 2020-08-13: 08:00:00 40 mg via ORAL
  Filled 2020-08-12: qty 1

## 2020-08-12 MED ORDER — ACETAMINOPHEN 500 MG PO TABS
ORAL_TABLET | ORAL | Status: AC
  Administered 2020-08-12: 06:00:00 1000 mg via ORAL
  Filled 2020-08-12: qty 2

## 2020-08-12 MED ORDER — 0.9 % SODIUM CHLORIDE (POUR BTL) OPTIME
TOPICAL | Status: DC | PRN
Start: 2020-08-12 — End: 2020-08-12
  Administered 2020-08-12: 08:00:00 1000 mL

## 2020-08-12 MED ORDER — INSULIN ASPART 100 UNIT/ML ~~LOC~~ SOLN
4.0000 [IU] | Freq: Three times a day (TID) | SUBCUTANEOUS | Status: DC
  Administered 2020-08-12 – 2020-08-13 (×3): 4 [IU] via SUBCUTANEOUS

## 2020-08-12 MED ORDER — DEXAMETHASONE SODIUM PHOSPHATE 10 MG/ML IJ SOLN
INTRAMUSCULAR | Status: DC | PRN
  Administered 2020-08-12: 10 mg

## 2020-08-12 MED ORDER — ONDANSETRON HCL 4 MG PO TABS: 4.0000 mg | ORAL_TABLET | Freq: Three times a day (TID) | ORAL | 1 refills | Status: DC | PRN

## 2020-08-12 MED ORDER — PROPOFOL 500 MG/50ML IV EMUL
INTRAVENOUS | Status: DC | PRN
  Administered 2020-08-12: 75 ug/kg/min via INTRAVENOUS
  Administered 2020-08-12: 80 ug/kg/min via INTRAVENOUS

## 2020-08-12 MED ORDER — PROPOFOL 1000 MG/100ML IV EMUL
INTRAVENOUS | Status: AC
  Filled 2020-08-12: qty 100

## 2020-08-12 MED ORDER — POTASSIUM CHLORIDE 20 MEQ PO PACK
20.0000 meq | PACK | Freq: Two times a day (BID) | ORAL | Status: DC
  Administered 2020-08-12 – 2020-08-13 (×2): 20 meq via ORAL
  Filled 2020-08-12 (×2): qty 1

## 2020-08-12 MED ORDER — TRAMADOL HCL 50 MG PO TABS: 50.0000 mg | ORAL_TABLET | Freq: Four times a day (QID) | ORAL | Status: DC | PRN

## 2020-08-12 MED ORDER — ONDANSETRON HCL 4 MG/2ML IJ SOLN: 4.0000 mg | Freq: Four times a day (QID) | INTRAMUSCULAR | Status: DC | PRN

## 2020-08-12 MED ORDER — DEXAMETHASONE SODIUM PHOSPHATE 10 MG/ML IJ SOLN
INTRAMUSCULAR | Status: DC | PRN
  Administered 2020-08-12: 08:00:00 5 mg via INTRAVENOUS

## 2020-08-12 MED ORDER — ONDANSETRON HCL 4 MG/2ML IJ SOLN
INTRAMUSCULAR | Status: DC | PRN
  Administered 2020-08-12: 4 mg via INTRAVENOUS

## 2020-08-12 MED ORDER — FENTANYL CITRATE (PF) 100 MCG/2ML IJ SOLN
INTRAMUSCULAR | Status: AC
  Filled 2020-08-12: qty 2

## 2020-08-12 MED ORDER — ASPIRIN 81 MG PO CHEW
81.0000 mg | CHEWABLE_TABLET | Freq: Two times a day (BID) | ORAL | Status: DC
  Administered 2020-08-13: 08:00:00 81 mg via ORAL
  Filled 2020-08-12: qty 1

## 2020-08-12 MED ORDER — HYDROMORPHONE HCL 1 MG/ML IJ SOLN: 0.5000 mg | INTRAMUSCULAR | Status: DC | PRN

## 2020-08-12 MED ORDER — INSULIN ASPART 100 UNIT/ML ~~LOC~~ SOLN
0.0000 [IU] | Freq: Three times a day (TID) | SUBCUTANEOUS | Status: DC
  Administered 2020-08-12: 18:00:00 8 [IU] via SUBCUTANEOUS
  Administered 2020-08-12: 13:00:00 3 [IU] via SUBCUTANEOUS
  Administered 2020-08-13: 10:00:00 5 [IU] via SUBCUTANEOUS

## 2020-08-12 MED ORDER — TRANEXAMIC ACID 1000 MG/10ML IV SOLN
INTRAVENOUS | Status: DC | PRN
  Administered 2020-08-12: 08:00:00 2000 mg via TOPICAL

## 2020-08-12 MED ORDER — UMECLIDINIUM BROMIDE 62.5 MCG/INH IN AEPB
1.0000 | INHALATION_SPRAY | Freq: Every day | RESPIRATORY_TRACT | Status: DC
  Administered 2020-08-13: 08:00:00 1 via RESPIRATORY_TRACT
  Filled 2020-08-12: qty 7

## 2020-08-12 MED ORDER — SODIUM CHLORIDE 0.9% FLUSH
INTRAVENOUS | Status: DC | PRN
  Administered 2020-08-12: 30 mL

## 2020-08-12 MED ORDER — ONDANSETRON HCL 4 MG/2ML IJ SOLN: 4.0000 mg | Freq: Once | INTRAMUSCULAR | Status: DC | PRN

## 2020-08-12 MED ORDER — SODIUM CHLORIDE 0.9 % IV SOLN: INTRAVENOUS | Status: DC

## 2020-08-12 MED ORDER — TRANEXAMIC ACID-NACL 1000-0.7 MG/100ML-% IV SOLN
1000.0000 mg | INTRAVENOUS | Status: AC
  Administered 2020-08-12: 08:00:00 1000 mg via INTRAVENOUS
  Filled 2020-08-12: qty 100

## 2020-08-12 MED ORDER — ZOLPIDEM TARTRATE 5 MG PO TABS: 5.0000 mg | ORAL_TABLET | Freq: Every evening | ORAL | Status: DC | PRN

## 2020-08-12 MED ORDER — BISACODYL 10 MG RE SUPP: 10.0000 mg | Freq: Every day | RECTAL | Status: DC | PRN

## 2020-08-12 MED ORDER — DOCUSATE SODIUM 100 MG PO CAPS
100.0000 mg | ORAL_CAPSULE | Freq: Two times a day (BID) | ORAL | Status: DC
  Administered 2020-08-12 – 2020-08-13 (×2): 100 mg via ORAL
  Filled 2020-08-12 (×2): qty 1

## 2020-08-12 MED ORDER — DIPHENHYDRAMINE HCL 12.5 MG/5ML PO ELIX
12.5000 mg | ORAL_SOLUTION | ORAL | Status: DC | PRN
Start: 2020-08-12 — End: 2020-08-13

## 2020-08-12 MED ORDER — TIOTROPIUM BROMIDE MONOHYDRATE 18 MCG IN CAPS: 18.0000 ug | ORAL_CAPSULE | Freq: Every day | RESPIRATORY_TRACT | Status: DC

## 2020-08-12 MED ORDER — PHENYLEPHRINE HCL-NACL 10-0.9 MG/250ML-% IV SOLN
INTRAVENOUS | Status: DC | PRN
  Administered 2020-08-12: 40 ug/min via INTRAVENOUS

## 2020-08-12 MED ORDER — PROPOFOL 10 MG/ML IV BOLUS
INTRAVENOUS | Status: DC | PRN
  Administered 2020-08-12: 20 mg via INTRAVENOUS

## 2020-08-12 MED ORDER — HYDROCHLOROTHIAZIDE 25 MG PO TABS
25.0000 mg | ORAL_TABLET | Freq: Every day | ORAL | Status: DC
Start: 2020-08-13 — End: 2020-08-13
  Filled 2020-08-12 (×2): qty 1

## 2020-08-12 MED ORDER — ORAL CARE MOUTH RINSE: 15.0000 mL | Freq: Once | OROMUCOSAL | Status: AC

## 2020-08-12 MED ORDER — BUPIVACAINE-EPINEPHRINE (PF) 0.25% -1:200000 IJ SOLN
INTRAMUSCULAR | Status: DC | PRN
  Administered 2020-08-12: 30 mL via PERINEURAL

## 2020-08-12 MED ORDER — DOCUSATE SODIUM 100 MG PO CAPS: 100.0000 mg | ORAL_CAPSULE | Freq: Every day | ORAL | 2 refills | Status: DC | PRN

## 2020-08-12 MED ORDER — FENTANYL CITRATE (PF) 100 MCG/2ML IJ SOLN
INTRAMUSCULAR | Status: DC | PRN
  Administered 2020-08-12: 50 ug via INTRAVENOUS

## 2020-08-12 MED ORDER — FLEET ENEMA 7-19 GM/118ML RE ENEM: 1.0000 | ENEMA | Freq: Once | RECTAL | Status: DC | PRN

## 2020-08-12 MED ORDER — SODIUM CHLORIDE (PF) 0.9 % IJ SOLN
INTRAMUSCULAR | Status: AC
  Filled 2020-08-12: qty 50

## 2020-08-12 MED ORDER — LOSARTAN POTASSIUM 50 MG PO TABS
100.0000 mg | ORAL_TABLET | Freq: Every day | ORAL | Status: DC
  Administered 2020-08-13: 08:00:00 100 mg via ORAL
  Filled 2020-08-12: qty 2

## 2020-08-12 MED ORDER — OXYCODONE HCL 5 MG PO TABS: ORAL_TABLET | ORAL | 0 refills | Status: DC

## 2020-08-12 MED ORDER — PRAVASTATIN SODIUM 20 MG PO TABS
80.0000 mg | ORAL_TABLET | Freq: Every day | ORAL | Status: DC
  Administered 2020-08-13: 08:00:00 80 mg via ORAL
  Filled 2020-08-12: qty 4

## 2020-08-12 MED ORDER — FENTANYL CITRATE (PF) 100 MCG/2ML IJ SOLN
25.0000 ug | INTRAMUSCULAR | Status: DC | PRN
  Administered 2020-08-12: 50 ug via INTRAVENOUS

## 2020-08-12 MED ORDER — OXYCODONE HCL 5 MG PO TABS
5.0000 mg | ORAL_TABLET | ORAL | Status: DC | PRN
  Administered 2020-08-12: 5 mg via ORAL
  Administered 2020-08-12: 10 mg via ORAL
  Administered 2020-08-12 – 2020-08-13 (×3): 5 mg via ORAL
  Filled 2020-08-12 (×4): qty 1
  Filled 2020-08-12: qty 2

## 2020-08-12 MED ORDER — LOSARTAN POTASSIUM-HCTZ 100-25 MG PO TABS: 1.0000 | ORAL_TABLET | Freq: Every day | ORAL | Status: DC

## 2020-08-12 MED ORDER — BUPIVACAINE LIPOSOME 1.3 % IJ SUSP
INTRAMUSCULAR | Status: DC | PRN
  Administered 2020-08-12: 20 mL

## 2020-08-12 MED ORDER — CHLORHEXIDINE GLUCONATE 0.12 % MT SOLN
15.0000 mL | Freq: Once | OROMUCOSAL | Status: AC
  Administered 2020-08-12: 15 mL via OROMUCOSAL

## 2020-08-12 MED ORDER — PHENOL 1.4 % MT LIQD: 1.0000 | OROMUCOSAL | Status: DC | PRN

## 2020-08-12 MED ORDER — ASPIRIN 81 MG PO TABS: 81.0000 mg | ORAL_TABLET | Freq: Two times a day (BID) | ORAL | 0 refills | Status: AC

## 2020-08-12 MED ORDER — INSULIN ASPART 100 UNIT/ML ~~LOC~~ SOLN
0.0000 [IU] | Freq: Every day | SUBCUTANEOUS | Status: DC
  Administered 2020-08-12: 22:00:00 4 [IU] via SUBCUTANEOUS

## 2020-08-12 MED ORDER — POLYETHYLENE GLYCOL 3350 17 G PO PACK: 17.0000 g | PACK | Freq: Every day | ORAL | Status: DC | PRN

## 2020-08-12 MED ORDER — MENTHOL 3 MG MT LOZG: 1.0000 | LOZENGE | OROMUCOSAL | Status: DC | PRN

## 2020-08-12 MED ORDER — ALBUTEROL SULFATE HFA 108 (90 BASE) MCG/ACT IN AERS: 1.0000 | INHALATION_SPRAY | Freq: Four times a day (QID) | RESPIRATORY_TRACT | Status: DC | PRN

## 2020-08-12 MED ORDER — POVIDONE-IODINE 10 % EX SWAB
2.0000 "application " | Freq: Once | CUTANEOUS | Status: AC
  Administered 2020-08-12: 2 via TOPICAL

## 2020-08-12 MED ORDER — LEVOTHYROXINE SODIUM 25 MCG PO TABS
137.0000 ug | ORAL_TABLET | Freq: Every day | ORAL | Status: DC
  Administered 2020-08-13: 06:00:00 137 ug via ORAL
  Filled 2020-08-12: qty 1

## 2020-08-12 SURGICAL SUPPLY — 69 items
APL SKNCLS STERI-STRIP NONHPOA (GAUZE/BANDAGES/DRESSINGS) ×1
ATTUNE MED DOME PAT 38 KNEE (Knees) ×1 IMPLANT
ATTUNE MED DOME PAT 38MM KNEE (Knees) ×1 IMPLANT
ATTUNE PS FEM RT SZ 6 CEM KNEE (Femur) ×2 IMPLANT
ATTUNE PSRP INSR SZ6 5 KNEE (Insert) ×1 IMPLANT
ATTUNE PSRP INSR SZ6 5MM KNEE (Insert) ×1 IMPLANT
BAG DECANTER FOR FLEXI CONT (MISCELLANEOUS) ×3 IMPLANT
BAG SPEC THK2 15X12 ZIP CLS (MISCELLANEOUS) ×1
BAG ZIPLOCK 12X15 (MISCELLANEOUS) ×3 IMPLANT
BASE TIBIA ATTUNE KNEE SYS SZ6 (Knees) IMPLANT
BENZOIN TINCTURE PRP APPL 2/3 (GAUZE/BANDAGES/DRESSINGS) ×3 IMPLANT
BLADE SAGITTAL 25.0X1.19X90 (BLADE) ×2 IMPLANT
BLADE SAGITTAL 25.0X1.19X90MM (BLADE) ×1
BLADE SAW SGTL 13X75X1.27 (BLADE) ×3 IMPLANT
BLADE SURG 15 STRL LF DISP TIS (BLADE) ×1 IMPLANT
BLADE SURG 15 STRL SS (BLADE) ×3
BLADE SURG SZ10 CARB STEEL (BLADE) ×3 IMPLANT
BNDG CMPR MED 15X6 ELC VLCR LF (GAUZE/BANDAGES/DRESSINGS) ×1
BNDG ELASTIC 6X15 VLCR STRL LF (GAUZE/BANDAGES/DRESSINGS) ×3 IMPLANT
BOWL SMART MIX CTS (DISPOSABLE) ×3 IMPLANT
BSPLAT TIB 6 CMNT ROT PLAT STR (Knees) ×1 IMPLANT
CEMENT HV SMART SET (Cement) ×4 IMPLANT
CLOSURE STERI-STRIP 1/2X4 (GAUZE/BANDAGES/DRESSINGS) ×1
CLSR STERI-STRIP ANTIMIC 1/2X4 (GAUZE/BANDAGES/DRESSINGS) ×3 IMPLANT
COVER SURGICAL LIGHT HANDLE (MISCELLANEOUS) ×3 IMPLANT
COVER WAND RF STERILE (DRAPES) ×3 IMPLANT
CUFF TOURN SGL QUICK 34 (TOURNIQUET CUFF) ×3
CUFF TRNQT CYL 34X4.125X (TOURNIQUET CUFF) ×1 IMPLANT
DECANTER SPIKE VIAL GLASS SM (MISCELLANEOUS) ×6 IMPLANT
DRAPE INCISE IOBAN 66X45 STRL (DRAPES) IMPLANT
DRAPE ORTHO SPLIT 77X108 STRL (DRAPES) ×3
DRAPE SURG ORHT 6 SPLT 77X108 (DRAPES) ×1 IMPLANT
DRAPE U-SHAPE 47X51 STRL (DRAPES) ×3 IMPLANT
DRESSING AQUACEL AG SP 3.5X10 (GAUZE/BANDAGES/DRESSINGS) ×1 IMPLANT
DRSG AQUACEL AG SP 3.5X10 (GAUZE/BANDAGES/DRESSINGS) ×3
DURAPREP 26ML APPLICATOR (WOUND CARE) ×6 IMPLANT
ELECT REM PT RETURN 15FT ADLT (MISCELLANEOUS) ×3 IMPLANT
GLOVE BIOGEL PI IND STRL 8 (GLOVE) ×2 IMPLANT
GLOVE BIOGEL PI INDICATOR 8 (GLOVE) ×4
GLOVE SURG ORTHO 8.0 STRL STRW (GLOVE) ×3 IMPLANT
GLOVE SURG SS PI 7.5 STRL IVOR (GLOVE) ×3 IMPLANT
GOWN STRL REUS W/TWL XL LVL3 (GOWN DISPOSABLE) ×6 IMPLANT
HANDPIECE INTERPULSE COAX TIP (DISPOSABLE) ×3
HOLDER FOLEY CATH W/STRAP (MISCELLANEOUS) IMPLANT
HOOD PEEL AWAY FLYTE STAYCOOL (MISCELLANEOUS) ×3 IMPLANT
IMMOBILIZER KNEE 20 (SOFTGOODS) ×3
IMMOBILIZER KNEE 20 THIGH 36 (SOFTGOODS) ×1 IMPLANT
KIT TURNOVER KIT A (KITS) IMPLANT
MANIFOLD NEPTUNE II (INSTRUMENTS) ×3 IMPLANT
NEEDLE HYPO 22GX1.5 SAFETY (NEEDLE) ×6 IMPLANT
NS IRRIG 1000ML POUR BTL (IV SOLUTION) ×3 IMPLANT
PACK ICE MAXI GEL EZY WRAP (MISCELLANEOUS) ×3 IMPLANT
PACK TOTAL KNEE CUSTOM (KITS) ×3 IMPLANT
PENCIL SMOKE EVACUATOR (MISCELLANEOUS) IMPLANT
PIN DRILL FIX HALF THREAD (BIT) ×2 IMPLANT
PIN STEINMAN FIXATION KNEE (PIN) ×2 IMPLANT
PROTECTOR NERVE ULNAR (MISCELLANEOUS) ×3 IMPLANT
SET HNDPC FAN SPRY TIP SCT (DISPOSABLE) ×1 IMPLANT
SUT ETHIBOND NAB CT1 #1 30IN (SUTURE) ×6 IMPLANT
SUT MNCRL AB 3-0 PS2 18 (SUTURE) ×3 IMPLANT
SUT VIC AB 0 CT1 36 (SUTURE) ×3 IMPLANT
SUT VIC AB 2-0 CT1 27 (SUTURE) ×6
SUT VIC AB 2-0 CT1 TAPERPNT 27 (SUTURE) ×2 IMPLANT
SYR CONTROL 10ML LL (SYRINGE) ×9 IMPLANT
TIBIA ATTUNE KNEE SYS BASE SZ6 (Knees) ×3 IMPLANT
TOWEL OR 17X26 10 PK STRL BLUE (TOWEL DISPOSABLE) ×3 IMPLANT
TRAY FOLEY MTR SLVR 16FR STAT (SET/KITS/TRAYS/PACK) ×3 IMPLANT
WATER STERILE IRR 1000ML POUR (IV SOLUTION) ×6 IMPLANT
WRAP KNEE MAXI GEL POST OP (GAUZE/BANDAGES/DRESSINGS) ×2 IMPLANT

## 2020-08-12 NOTE — Brief Op Note (Signed)
08/12/2020  10:01 AM  PATIENT:  Carolyn Faulkner  74 y.o. female  PRE-OPERATIVE DIAGNOSIS:  OA RIGHT KNEE  POST-OPERATIVE DIAGNOSIS:  OA RIGHT KNEE  PROCEDURE:  Procedure(s): TOTAL KNEE ARTHROPLASTY (Right)  SURGEON:  Surgeon(s) and Role:    Frederico Hamman, MD - Primary  PHYSICIAN ASSISTANT:   ASSISTANTS: Margart Sickles, PA-C   ANESTHESIA:   local, regional, spinal and IV sedation  EBL:  25 mL   BLOOD ADMINISTERED:none  DRAINS: none   LOCAL MEDICATIONS USED:  MARCAINE     SPECIMEN:  No Specimen  DISPOSITION OF SPECIMEN:  N/A  COUNTS:  YES  TOURNIQUET:   Total Tourniquet Time Documented: Thigh (Right) - 61 minutes Total: Thigh (Right) - 61 minutes   DICTATION: .Other Dictation: Dictation Number unknown  PLAN OF CARE: Admit for overnight observation  PATIENT DISPOSITION:  PACU - hemodynamically stable.   Delay start of Pharmacological VTE agent (>24hrs) due to surgical blood loss or risk of bleeding: yes

## 2020-08-12 NOTE — Transfer of Care (Signed)
Immediate Anesthesia Transfer of Care Note  Patient: Carolyn Faulkner  Procedure(s) Performed: TOTAL KNEE ARTHROPLASTY (Right Knee)  Patient Location: PACU  Anesthesia Type:Spinal  Level of Consciousness: awake, alert , oriented and patient cooperative  Airway & Oxygen Therapy: Patient Spontanous Breathing and Patient connected to face mask  Post-op Assessment: Report given to RN and Post -op Vital signs reviewed and stable  Post vital signs: Reviewed and stable  Last Vitals:  Vitals Value Taken Time  BP    Temp    Pulse    Resp    SpO2      Last Pain:  Vitals:   08/12/20 0621  TempSrc: Oral  PainSc:       Patients Stated Pain Goal: 4 (08/12/20 0543)  Complications: No complications documented.

## 2020-08-12 NOTE — Discharge Instructions (Signed)

## 2020-08-12 NOTE — Evaluation (Signed)
Physical Therapy Evaluation Patient Details Name: Carolyn Faulkner MRN: [id number] DOB: [birth year] Today's Date: 08/12/2020   History of Present Illness  Patient is 74 y.o. female s/p Rt TKA on 08/12/20 with PMH significant for HTN, HLD, GERD, hypothyroidism, COPD, CHF, NSTEMI (2011), OA.    Clinical Impression  Carolyn Faulkner is a 74 y.o. female POD 0 s/p Rt TKA. Patient reports independence with mobility at baseline. Patient is now limited by functional impairments (see PT problem list below) and requires min assist for transfers and gait with RW. Patient was able to ambulate ~60 feet with RW and min assist. Patient instructed in exercise to facilitate ROM and circulation. Patient will benefit from continued skilled PT interventions to address impairments and progress towards PLOF. Acute PT will follow to progress mobility and stair training in preparation for safe discharge home.     Follow Up Recommendations Follow surgeon's recommendation for DC plan and follow-up therapies;Home health PT    Equipment Recommendations  None recommended by PT    Recommendations for Other Services       Precautions / Restrictions Precautions Precautions: Fall Restrictions Weight Bearing Restrictions: No Other Position/Activity Restrictions: WBAT      Mobility  Bed Mobility Overal bed mobility: Needs Assistance Bed Mobility: Supine to Sit     Supine to sit: Min assist;HOB elevated     General bed mobility comments: cues to use bed rail, assist to bring Rt LE off EOB and to raise trunk up fully.    Transfers Overall transfer level: Needs assistance Equipment used: Rolling walker (2 wheeled) Transfers: Sit to/from Stand Sit to Stand: Min assist;From elevated surface         General transfer comment: cuse for technique with RW and assist for power up from EOB. assist to slow/control lowering to sit in recliner.  Ambulation/Gait Ambulation/Gait assistance: Min assist;Min guard Gait Distance  (Feet): 60 Feet Assistive device: Rolling walker (2 wheeled) Gait Pattern/deviations: Step-to pattern;Decreased stride length;Decreased weight shift to right Gait velocity: decr   General Gait Details: cues for step pattern and proximity to RW, no overt LOB, assist for position of walker  Stairs            Wheelchair Mobility    Modified Rankin (Stroke Patients Only)       Balance Overall balance assessment: Needs assistance Sitting-balance support: Feet supported Sitting balance-Leahy Scale: Good     Standing balance support: During functional activity;Bilateral upper extremity supported Standing balance-Leahy Scale: Fair                               Pertinent Vitals/Pain Pain Assessment: 0-10 Pain Score: 5  Pain Location: Rt knee Pain Descriptors / Indicators: Aching Pain Intervention(s): Limited activity within patient's tolerance;Monitored during session;Repositioned;Ice applied    Home Living Family/patient expects to be discharged to:: Private residence Living Arrangements: Children;Other relatives (grandkids) Available Help at Discharge: Family Type of Home: House Home Access: Stairs to enter   [employer] of Steps: 3 at front with 2 rails, and 1 step at carport no rails +2 more to get into main level Home Layout: One level Home Equipment: Grab bars - tub/shower;Bedside commode;Walker - 2 wheels;Cane - single point Additional Comments: plans to go in through front of house    Prior Function Level of Independence: Independent               Hand Dominance   Dominant Hand: Right  Extremity/Trunk Assessment   Upper Extremity Assessment Upper Extremity Assessment: Overall WFL for tasks assessed    Lower Extremity Assessment Lower Extremity Assessment: RLE deficits/detail RLE Deficits / Details: good quad activation, no extensor lag with SLR, 3/5 or better RLE Sensation: WNL RLE Coordination: WNL    Cervical /  Trunk Assessment Cervical / Trunk Assessment: Normal  Communication   Communication: No difficulties  Cognition Arousal/Alertness: Awake/alert Behavior During Therapy: WFL for tasks assessed/performed Overall Cognitive Status: Within Functional Limits for tasks assessed                                        General Comments      Exercises Total Joint Exercises Ankle Circles/Pumps: AROM;Both;20 reps;Seated Quad Sets: AROM;Right;5 reps;Seated Heel Slides: AROM;Right;5 reps;Seated   Assessment/Plan    PT Assessment Patient needs continued PT services  PT Problem List Decreased strength;Decreased range of motion;Decreased activity tolerance;Decreased balance;Decreased mobility;Decreased knowledge of use of DME;Decreased knowledge of precautions;Pain       PT Treatment Interventions DME instruction;Gait training;Stair training;Functional mobility training;Therapeutic activities;Therapeutic exercise;Balance training;Patient/family education    PT Goals (Current goals can be found in the Care Plan section)  Acute Rehab PT Goals Patient Stated Goal: get walking with less pain PT Goal Formulation: With patient Time For Goal Achievement: 08/19/20 Potential to Achieve Goals: Good    Frequency 7X/week   Barriers to discharge        Co-evaluation               AM-PAC PT "6 Clicks" Mobility  Outcome Measure Help needed turning from your back to your side while in a flat bed without using bedrails?: A Little Help needed moving from lying on your back to sitting on the side of a flat bed without using bedrails?: A Little Help needed moving to and from a bed to a chair (including a wheelchair)?: A Little Help needed standing up from a chair using your arms (e.g., wheelchair or bedside chair)?: A Little Help needed to walk in hospital room?: A Little Help needed climbing 3-5 steps with a railing? : A Little 6 Click Score: 18    End of Session Equipment  Utilized During Treatment: Gait belt Activity Tolerance: Patient tolerated treatment well Patient left: in chair;with call bell/phone within reach;with chair alarm set Nurse Communication: Mobility status PT Visit Diagnosis: Muscle weakness (generalized) (M62.81);Difficulty in walking, not elsewhere classified (R26.2);Pain Pain - Right/Left: Right Pain - part of body: Knee    Time: 1342-1415 PT Time Calculation (min) (ACUTE ONLY): 33 min   Charges:   PT Evaluation $PT Eval Low Complexity: 1 Low PT Treatments $Gait Training: 8-22 mins        Wynn Maudlin, DPT Acute Rehabilitation Services Office [phone] Pager [phone]    Anitra Lauth 08/12/2020, 2:29 PM

## 2020-08-12 NOTE — Plan of Care (Signed)
  Problem: Education: Goal: Knowledge of General Education information will improve Description: Including pain rating scale, medication(s)/side effects and non-pharmacologic comfort measures Outcome: Progressing   Problem: Activity: Goal: Risk for activity intolerance will decrease Outcome: Progressing   Problem: Nutrition: Goal: Adequate nutrition will be maintained Outcome: Progressing   Problem: Elimination: Goal: Will not experience complications related to bowel motility Outcome: Progressing   Problem: Education: Goal: Knowledge of the prescribed therapeutic regimen will improve Outcome: Progressing   Problem: Activity: Goal: Ability to avoid complications of mobility impairment will improve Outcome: Progressing   Problem: Pain Management: Goal: Pain level will decrease with appropriate interventions Outcome: Progressing   

## 2020-08-12 NOTE — Anesthesia Procedure Notes (Signed)
Spinal  Patient location during procedure: OR Start time: 08/12/2020 7:35 AM End time: 08/12/2020 7:40 AM Staffing Performed: anesthesiologist  Anesthesiologist: Cecile Hearing, MD Preanesthetic Checklist Completed: patient identified, IV checked, risks and benefits discussed, surgical consent, monitors and equipment checked, pre-op evaluation and timeout performed Spinal Block Patient position: sitting Prep: DuraPrep and site prepped and draped Patient monitoring: continuous pulse ox and blood pressure Approach: midline Location: L3-4 Injection technique: single-shot Needle Needle type: Pencan  Needle gauge: 24 G Additional Notes Functioning IV was confirmed and monitors were applied. Sterile prep and drape, including hand hygiene, mask and sterile gloves were used. The patient was positioned and the spine was prepped. The skin was anesthetized with lidocaine.  Free flow of clear CSF was obtained prior to injecting local anesthetic into the CSF.  The spinal needle aspirated freely following injection.  The needle was carefully withdrawn.  The patient tolerated the procedure well. Consent was obtained prior to procedure with all questions answered and concerns addressed. Risks including but not limited to bleeding, infection, nerve damage, paralysis, failed block, inadequate analgesia, allergic reaction, high spinal, itching and headache were discussed and the patient wished to proceed.   Carolyn Aran, MD

## 2020-08-12 NOTE — Progress Notes (Signed)
Orthopedic Tech Progress Note Patient Details:  Carolyn Faulkner 07/25/1946 [id number]  Patient ID: Carolyn Faulkner, female   DOB: [birth year], 74 y.o.   MRN: [id number]   Carolyn Faulkner 08/12/2020, 10:57 AM cpm placed in PACU @1055 

## 2020-08-12 NOTE — Anesthesia Postprocedure Evaluation (Signed)
Anesthesia Post Note  Patient: Carolyn Faulkner  Procedure(s) Performed: TOTAL KNEE ARTHROPLASTY (Right Knee)     Patient location during evaluation: PACU Anesthesia Type: Spinal Level of consciousness: oriented and awake and alert Pain management: pain level controlled Vital Signs Assessment: post-procedure vital signs reviewed and stable Respiratory status: spontaneous breathing, respiratory function stable and patient connected to nasal cannula oxygen Cardiovascular status: blood pressure returned to baseline and stable Postop Assessment: no headache, no backache, no apparent nausea or vomiting and spinal receding Anesthetic complications: no   No complications documented.  Last Vitals:  Vitals:   08/12/20 1030 08/12/20 1045  BP: (!) 125/48 109/64  Pulse: 65 72  Resp: 18 18  Temp:    SpO2: 99% 100%    Last Pain:  Vitals:   08/12/20 1030  TempSrc:   PainSc: 5                  Cecile Hearing

## 2020-08-12 NOTE — Anesthesia Procedure Notes (Signed)
Anesthesia Regional Block: Adductor canal block   Pre-Anesthetic Checklist: ,, timeout performed, Correct Patient, Correct Site, Correct Laterality, Correct Procedure, Correct Position, site marked, Risks and benefits discussed,  Surgical consent,  Pre-op evaluation,  At surgeon's request and post-op pain management  Laterality: Right  Prep: chloraprep       Needles:  Injection technique: Single-shot  Needle Type: Echogenic Needle     Needle Length: 9cm  Needle Gauge: 21     Additional Needles:   Procedures:,,,, ultrasound used (permanent image in chart),,,,  Narrative:  Start time: 08/12/2020 6:45 AM End time: 08/12/2020 6:55 AM Injection made incrementally with aspirations every 5 mL.  Performed by: Personally  Anesthesiologist: Cecile Hearing, MD  Additional Notes: No pain on injection. No increased resistance to injection. Injection made in 5cc increments.  Good needle visualization.  Patient tolerated procedure well.

## 2020-08-12 NOTE — Interval H&P Note (Signed)
History and Physical Interval Note:  08/12/2020 7:30 AM  Carolyn Faulkner  has presented today for surgery, with the diagnosis of OA RIGHT KNEE.  The various methods of treatment have been discussed with the patient and family. After consideration of risks, benefits and other options for treatment, the patient has consented to  Procedure(s): TOTAL KNEE ARTHROPLASTY (Right) as a surgical intervention.  The patient's history has been reviewed, patient examined, no change in status, stable for surgery.  I have reviewed the patient's chart and labs.  Questions were answered to the patient's satisfaction.     Thera Flake

## 2020-08-13 DIAGNOSIS — M1711 Unilateral primary osteoarthritis, right knee: Secondary | ICD-10-CM | POA: Diagnosis not present

## 2020-08-13 DIAGNOSIS — M25761 Osteophyte, right knee: Secondary | ICD-10-CM | POA: Diagnosis not present

## 2020-08-13 DIAGNOSIS — Z885 Allergy status to narcotic agent status: Secondary | ICD-10-CM | POA: Diagnosis not present

## 2020-08-13 DIAGNOSIS — Z888 Allergy status to other drugs, medicaments and biological substances status: Secondary | ICD-10-CM | POA: Diagnosis not present

## 2020-08-13 DIAGNOSIS — E119 Type 2 diabetes mellitus without complications: Secondary | ICD-10-CM | POA: Diagnosis not present

## 2020-08-13 DIAGNOSIS — Z79899 Other long term (current) drug therapy: Secondary | ICD-10-CM | POA: Diagnosis not present

## 2020-08-13 DIAGNOSIS — I252 Old myocardial infarction: Secondary | ICD-10-CM | POA: Diagnosis not present

## 2020-08-13 DIAGNOSIS — G8929 Other chronic pain: Secondary | ICD-10-CM | POA: Diagnosis not present

## 2020-08-13 DIAGNOSIS — M21161 Varus deformity, not elsewhere classified, right knee: Secondary | ICD-10-CM | POA: Diagnosis not present

## 2020-08-13 DIAGNOSIS — Z96651 Presence of right artificial knee joint: Secondary | ICD-10-CM | POA: Diagnosis not present

## 2020-08-13 DIAGNOSIS — J449 Chronic obstructive pulmonary disease, unspecified: Secondary | ICD-10-CM | POA: Diagnosis not present

## 2020-08-13 DIAGNOSIS — Z87891 Personal history of nicotine dependence: Secondary | ICD-10-CM | POA: Diagnosis not present

## 2020-08-13 DIAGNOSIS — Z7982 Long term (current) use of aspirin: Secondary | ICD-10-CM | POA: Diagnosis not present

## 2020-08-13 DIAGNOSIS — M24561 Contracture, right knee: Secondary | ICD-10-CM | POA: Diagnosis not present

## 2020-08-13 LAB — GLUCOSE, CAPILLARY: Glucose-Capillary: 227 mg/dL — ABNORMAL HIGH (ref 70–99)

## 2020-08-13 MED ORDER — ONDANSETRON HCL 4 MG PO TABS: 4.0000 mg | ORAL_TABLET | Freq: Three times a day (TID) | ORAL | 1 refills | Status: AC | PRN

## 2020-08-13 MED ORDER — OXYCODONE HCL 5 MG PO TABS: ORAL_TABLET | ORAL | 0 refills | Status: AC

## 2020-08-13 MED ORDER — DOCUSATE SODIUM 100 MG PO CAPS: 100.0000 mg | ORAL_CAPSULE | Freq: Every day | ORAL | 2 refills | Status: AC | PRN

## 2020-08-13 NOTE — TOC Initial Note (Signed)
Transition of Care [facility]) - Initial/Assessment Note    Patient Details  Name: Carolyn Faulkner MRN: [id number] Date of Birth: [birth year]  Transition of Care (TOC) CM/SW Contact:    Armanda Heritage, RN Phone Number: 08/13/2020, 11:36 AM  Clinical Narrative:                 CM spoke with patient who reports she has all necessary dme at home.  [facility] to provide HHPT.  Expected Discharge Plan: Home w Home Health Services Barriers to Discharge: Continued Medical Work up   Patient Goals and CMS Choice Patient states their goals for this hospitalization and ongoing recovery are:: to go home with therapy CMS Medicare.gov Compare Post Acute Care list provided to:: Patient Choice offered to / list presented to : Patient  Expected Discharge Plan and Services Expected Discharge Plan: Home w Home Health Services   Discharge Planning Services: CM Consult Post Acute Care Choice: Home Health Living arrangements for the past 2 months: Single Family Home Expected Discharge Date: 08/13/20               DME Arranged: N/A DME Agency: NA       HH Arranged: PT HH Agency: [facility]     Representative spoke with at [facility] Agency: pre-arranged in md clinic  Prior Living Arrangements/Services Living arrangements for the past 2 months: Single Family Home   Patient language and need for interpreter reviewed:: Yes Do you feel safe going back to the place where you live?: Yes      Need for Family Participation in Patient Care: Yes (Comment) Care giver support system in place?: Yes (comment)   Criminal Activity/Legal Involvement Pertinent to Current Situation/Hospitalization: No - Comment as needed  Activities of Daily Living Home Assistive Devices/Equipment: Eyeglasses,Walker (specify type),Grab bars around toilet,Raised toilet seat with rails ADL Screening (condition at time of admission) Patient's cognitive ability adequate to safely complete daily activities?:  Yes Is the patient deaf or have difficulty hearing?: No Does the patient have difficulty seeing, even when wearing glasses/contacts?: No Does the patient have difficulty concentrating, remembering, or making decisions?: No Patient able to express need for assistance with ADLs?: Yes Does the patient have difficulty dressing or bathing?: No Independently performs ADLs?: Yes (appropriate for developmental age) Does the patient have difficulty walking or climbing stairs?: Yes Weakness of Legs: None Weakness of Arms/Hands: None  Permission Sought/Granted                  Emotional Assessment Appearance:: Appears stated age Attitude/Demeanor/Rapport: Engaged Affect (typically observed): Accepting Orientation: : Oriented to Self,Oriented to Place,Oriented to  Time,Oriented to Situation   Psych Involvement: No (comment)  Admission diagnosis:  Primary localized osteoarthritis of right knee [M17.11] Patient Active Problem List   Diagnosis Date Noted  . Primary localized osteoarthritis of right knee 08/12/2020  . Painful orthopaedic hardware (HCC) 03/05/2014  . Takotsubo cardiomyopathy 08/23/2011  . Lung nodule 08/02/2011  . Non-STEMI (non-ST elevated myocardial infarction) (HCC) 07/31/2011  . Chest pain   . Obesity (BMI 30-39.9)   . Lumbar disc disease   . Bronchitis, acute, with bronchospasm   . COPD 09/18/2010  . Hypothyroidism   . Hyperlipidemia   . Hypertension   . Postinflammatory pulmonary fibrosis (HCC)    PCP:  Lonie Peak, PA-C Pharmacy:   [facility] [phone] - [address], [place] - [address] AT [facility] [address] [place] [address] Phone: [phone] Fax: [phone]  Social Determinants of Health (SDOH) Interventions    Readmission Risk Interventions No flowsheet data found.

## 2020-08-13 NOTE — Progress Notes (Signed)
Pt d/c home in stable condition with family. No needs at time of d/c. No home equipment needed at time of d/c. Pt home health arranged prior to d/c.

## 2020-08-13 NOTE — Evaluation (Signed)
Occupational Therapy Evaluation Patient Details Name: Carolyn Faulkner MRN: [id number] DOB: 01-Apr-1946 Today's Date: 08/13/2020    History of Present Illness Patient is 74 y.o. female s/p Rt TKA on 08/12/20 with PMH significant for HTN, HLD, GERD, hypothyroidism, COPD, CHF, NSTEMI (2011), OA.   Clinical Impression   Carolyn Faulkner is a 74 year old woman s/p R TKA who presents with decreased ROM and strength of RLE and complaints of pain. On evaluation patient demonstrates ability to safely perform functional mobility with RW, perform toilet transfer with use of BSC, stand at sink to perform grooming task and don shirts. Patient reports having a walk in shower to use at home and using [facility] for shower chair, the assistance of her daughter for IADLs and LB dressing as needed. Patient demonstrates functional abilities in order to return home. NO OT needs a this time.     Follow Up Recommendations  No OT follow up    Equipment Recommendations  None recommended by OT    Recommendations for Other Services       Precautions / Restrictions Precautions Precautions: Fall Restrictions Weight Bearing Restrictions: No Other Position/Activity Restrictions: WBAT      Mobility Bed Mobility Overal bed mobility: Modified Independent                  Transfers Overall transfer level: Needs assistance Equipment used: Rolling walker (2 wheeled) Transfers: Sit to/from Stand Sit to Stand: Min guard;From elevated surface         General transfer comment: min guard for all ambulation in room with RW. Verbal cue for walker management x 1, and to extend knee with sitting.    Balance Overall balance assessment: Mild deficits observed, not formally tested                                         ADL either performed or assessed with clinical judgement   ADL Overall ADL's : Needs assistance/impaired Eating/Feeding: Independent   Grooming: Independent   Upper Body Bathing:  Independent   Lower Body Bathing: Minimal assistance   Upper Body Dressing : Independent   Lower Body Dressing: Minimal assistance   Toilet Transfer: Independent   Toileting- Clothing Manipulation and Hygiene: Independent       Functional mobility during ADLs: Min guard;Rolling walker       Vision Patient Visual Report: No change from baseline       Perception     Praxis      Pertinent Vitals/Pain Pain Assessment: 0-10 Pain Score: 5  Pain Descriptors / Indicators: Aching;Sore Pain Intervention(s): Premedicated before session;Monitored during session     Hand Dominance Right   Extremity/Trunk Assessment Upper Extremity Assessment Upper Extremity Assessment: Overall WFL for tasks assessed   Lower Extremity Assessment Lower Extremity Assessment: Defer to PT evaluation       Communication Communication Communication: No difficulties   Cognition Arousal/Alertness: Awake/alert Behavior During Therapy: WFL for tasks assessed/performed Overall Cognitive Status: Within Functional Limits for tasks assessed                                     General Comments       Exercises     Shoulder Instructions      Home Living Family/patient expects to be discharged to:: Private residence Living Arrangements:  Children;Other relatives (grandkids) Available Help at Discharge: Family Type of Home: House Home Access: Stairs to enter [employer] of Steps: 3 at front with 2 rails, and 1 step at carport no rails +2 more to get into main level   Home Layout: One level     Bathroom Shower/Tub: Producer, television/film/video: Handicapped height Bathroom Accessibility: Yes   Home Equipment: Grab bars - tub/shower;Bedside commode;Walker - 2 wheels;Cane - single point   Additional Comments: plans to go in through front of house      Prior Functioning/Environment Level of Independence: Independent                 OT Problem List:  Decreased strength;Decreased range of motion;Decreased knowledge of use of DME or AE      OT Treatment/Interventions:      OT Goals(Current goals can be found in the care plan section) Acute Rehab OT Goals Patient Stated Goal: get walking with less pain OT Goal Formulation: All assessment and education complete, DC therapy  OT Frequency:     Barriers to D/C:            Co-evaluation              AM-PAC OT "6 Clicks" Daily Activity     Outcome Measure Help from another person eating meals?: None Help from another person taking care of personal grooming?: None Help from another person toileting, which includes using toliet, bedpan, or urinal?: None Help from another person bathing (including washing, rinsing, drying)?: None Help from another person to put on and taking off regular upper body clothing?: None Help from another person to put on and taking off regular lower body clothing?: None 6 Click Score: 24   End of Session Equipment Utilized During Treatment: Rolling walker Nurse Communication: Mobility status  Activity Tolerance: Patient tolerated treatment well Patient left: in chair;with call bell/phone within reach;with chair alarm set  OT Visit Diagnosis: Other abnormalities of gait and mobility (R26.89);Muscle weakness (generalized) (M62.81);Pain Pain - Right/Left: Right Pain - part of body: Knee                Time: 9323-5573 OT Time Calculation (min): 14 min Charges:  OT General Charges $OT Visit: 1 Visit OT Evaluation $OT Eval Low Complexity: 1 Low  Shahida Schnackenberg, OTR/L Acute Care Rehab Services  Office [phone] Pager: [phone]   Kelli Churn 08/13/2020, 9:46 AM

## 2020-08-13 NOTE — Op Note (Signed)
NAMEDEVANY, Carolyn MEDICAL RECORD [id number] ACCOUNT [id number] DATE OF BIRTH:07-Apr-1946 FACILITY: [facility] LOCATION: [id number] PHYSICIAN:W. Shaniqua Guillot JR., MD  OPERATIVE REPORT  DATE OF PROCEDURE:  08/12/2020  PREOPERATIVE DIAGNOSIS:  Osteoarthritis, right knee with varus deformity, flexion contracture.  POSTOPERATIVE DIAGNOSIS:  Osteoarthritis, right knee with varus deformity, flexion contracture.  OPERATION:  Right total knee (Attune cemented knee, size 6 femur, tibia with a 5 mm bearing, 38 mm all poly patella).  SURGEON:  Marcie Mowers, MD  ASSISTANTVincent Peyer.  TOURNIQUET TIME:  60 minutes.  ANESTHESIA:  Spinal with block.  DESCRIPTION OF PROCEDURE:  Exsanguination of leg inflation of thigh tourniquet 350.  Straight skin incision with a medial parapatellar approach to the knee made, a moderately severe varus knee was noted with a flexion contracture of about 15 degrees.  We  did a 5-degree 10 mm cut on the distal femur and cut 6 mm below the least diseased lateral compartment with the extension gap being eventually measured at 5 mm.  Femur was sized to be a size 6, placement of the all-in-1 cutting block accomplishing the  anterior, posterior and chamfer cuts.  Flexion gap equaled the extension gap at 5 mm.  Small osteophytes removed, particularly from the posterior medial knee.  Tibia was sized to be a size 6, placement of the keel cut on the tibia and the box cut on the  femoral trial was placed.  The patella was cut resecting 9.5 mm of patella, placement of the 38 mm all poly trial.  With the trials in, alignment was excellent resolution of the varus deformity, the knee was straight with 0 degrees compared to the  preoperative flexion contracture, very stable in flexion and extension to varus and valgus.  Cement was prepared on the back table.  Pulse lavage used on the bone as well as injection of Exparel and Marcaine with addition of TXA topically.  We inserted the  prosthesis, tibia followed by femur and patella.  Cement was allowed to harden with the  trial bearing.  Trial bearing was removed.  No excess cement was noted at the posterior aspect of the knee.  Tourniquet was released under direct vision, no excessive bleeding was noted.  Small bleeders were coagulated.  Final bearing was placed.   Closure of the capsule was with #1 Ethibond, 2-0 Vicryl and Monocryl in the skin.  Lightly compressive sterile dressing and knee immobilizer applied, taken to recovery room in stable condition.  HN/NUANCE  D:08/12/2020 T:08/13/2020 JOB:[id number]

## 2020-08-13 NOTE — Progress Notes (Signed)
Physical Therapy Treatment Patient Details Name: Carolyn Faulkner MRN: [id number] DOB: 02-08-1946 Today's Date: 08/13/2020    History of Present Illness Patient is 74 y.o. female s/p Rt TKA on 08/12/20 with PMH significant for HTN, HLD, GERD, hypothyroidism, COPD, CHF, NSTEMI (2011), OA.    PT Comments    Progressing with mobility. Pt rated pain 7/10 in posterior knee with activity. Reviewed/practiced exercises, gait training, and stair training. Pt stated she would have plenty of help to get into house. All education completed. Okay to d/c from PT standpoint.    Follow Up Recommendations  Follow surgeon's recommendation for DC plan and follow-up therapies (plan is for HHPT)     Equipment Recommendations  None recommended by PT    Recommendations for Other Services       Precautions / Restrictions Precautions Precautions: Fall Restrictions Weight Bearing Restrictions: No Other Position/Activity Restrictions: WBAT    Mobility  Bed Mobility Overal bed mobility: Modified Independent             General bed mobility comments: oob in recliner  Transfers Overall transfer level: Needs assistance Equipment used: Rolling walker (2 wheeled) Transfers: Sit to/from Stand Sit to Stand: Min guard         General transfer comment: VCs safety, hand/LE placement. Min guard for safety. Increased time.  Ambulation/Gait Ambulation/Gait assistance: Min guard Gait Distance (Feet): 75 Feet Assistive device: Rolling walker (2 wheeled) Gait Pattern/deviations: Step-to pattern;Step-through pattern;Decreased stride length     General Gait Details: Min guard for safety. Slow gait speed. Pt c/o posterior knee pain with movement of R LE.   Stairs Stairs: Yes Stairs assistance: Min guard Stair Management: Step to pattern;Forwards;Two rails Number of Stairs: 2 General stair comments: VCs safety, technique, sequence. Min guard for safety.   Wheelchair Mobility    Modified Rankin  (Stroke Patients Only)       Balance Overall balance assessment: Needs assistance         Standing balance support: Bilateral upper extremity supported Standing balance-Leahy Scale: Poor                              Cognition Arousal/Alertness: Awake/alert Behavior During Therapy: WFL for tasks assessed/performed Overall Cognitive Status: Within Functional Limits for tasks assessed                                        Exercises Total Joint Exercises Ankle Circles/Pumps: AROM;Both;10 reps;Seated Long Arc Quad: AROM;Right;10 reps;Seated Knee Flexion: AROM;Right;10 reps;Seated Goniometric ROM: ~5-75 degrees    General Comments        Pertinent Vitals/Pain Pain Assessment: 0-10 Pain Score: 7  Pain Location: R posterior knee Pain Descriptors / Indicators: Aching;Discomfort;Sore;Tightness Pain Intervention(s): Monitored during session;Repositioned;Ice applied;Patient requesting pain meds-RN notified    Home Living Family/patient expects to be discharged to:: Private residence Living Arrangements: Children;Other relatives (grandkids) Available Help at Discharge: Family Type of Home: House Home Access: Stairs to enter   Home Layout: One level Home Equipment: Grab bars - tub/shower;Bedside commode;Walker - 2 wheels;Cane - single point Additional Comments: plans to go in through front of house    Prior Function Level of Independence: Independent          PT Goals (current goals can now be found in the care plan section) Acute Rehab PT Goals Patient Stated Goal: get walking with less  pain Progress towards PT goals: Progressing toward goals    Frequency    7X/week      PT Plan Current plan remains appropriate    Co-evaluation              AM-PAC PT "6 Clicks" Mobility   Outcome Measure  Help needed turning from your back to your side while in a flat bed without using bedrails?: A Little Help needed moving from lying on  your back to sitting on the side of a flat bed without using bedrails?: A Little Help needed moving to and from a bed to a chair (including a wheelchair)?: A Little Help needed standing up from a chair using your arms (e.g., wheelchair or bedside chair)?: A Little Help needed to walk in hospital room?: A Little Help needed climbing 3-5 steps with a railing? : A Little 6 Click Score: 18    End of Session Equipment Utilized During Treatment: Gait belt Activity Tolerance: Patient tolerated treatment well Patient left: in chair;with call bell/phone within reach;with chair alarm set   PT Visit Diagnosis: Pain;Other abnormalities of gait and mobility (R26.89) Pain - Right/Left: Right Pain - part of body: Knee     Time: 1000-1024 PT Time Calculation (min) (ACUTE ONLY): 24 min  Charges:  $Gait Training: 8-22 mins $Therapeutic Exercise: 8-22 mins                        Faye Ramsay, PT Acute Rehabilitation  Office: [phone] Pager: [phone]

## 2020-08-14 DIAGNOSIS — Z471 Aftercare following joint replacement surgery: Secondary | ICD-10-CM | POA: Diagnosis not present

## 2020-08-14 DIAGNOSIS — J449 Chronic obstructive pulmonary disease, unspecified: Secondary | ICD-10-CM | POA: Diagnosis not present

## 2020-08-14 DIAGNOSIS — E785 Hyperlipidemia, unspecified: Secondary | ICD-10-CM | POA: Diagnosis not present

## 2020-08-14 DIAGNOSIS — M5136 Other intervertebral disc degeneration, lumbar region: Secondary | ICD-10-CM | POA: Diagnosis not present

## 2020-08-14 DIAGNOSIS — I509 Heart failure, unspecified: Secondary | ICD-10-CM | POA: Diagnosis not present

## 2020-08-14 DIAGNOSIS — Z96651 Presence of right artificial knee joint: Secondary | ICD-10-CM | POA: Diagnosis not present

## 2020-08-14 DIAGNOSIS — Z7982 Long term (current) use of aspirin: Secondary | ICD-10-CM | POA: Diagnosis not present

## 2020-08-14 DIAGNOSIS — I110 Hypertensive heart disease with heart failure: Secondary | ICD-10-CM | POA: Diagnosis not present

## 2020-08-14 DIAGNOSIS — Z7984 Long term (current) use of oral hypoglycemic drugs: Secondary | ICD-10-CM | POA: Diagnosis not present

## 2020-08-14 DIAGNOSIS — E039 Hypothyroidism, unspecified: Secondary | ICD-10-CM | POA: Diagnosis not present

## 2020-08-14 DIAGNOSIS — I252 Old myocardial infarction: Secondary | ICD-10-CM | POA: Diagnosis not present

## 2020-08-14 DIAGNOSIS — Z87891 Personal history of nicotine dependence: Secondary | ICD-10-CM | POA: Diagnosis not present

## 2020-08-14 DIAGNOSIS — K219 Gastro-esophageal reflux disease without esophagitis: Secondary | ICD-10-CM | POA: Diagnosis not present

## 2020-08-14 DIAGNOSIS — I5181 Takotsubo syndrome: Secondary | ICD-10-CM | POA: Diagnosis not present

## 2020-08-15 ENCOUNTER — Encounter (HOSPITAL_COMMUNITY): Payer: Self-pay | Admitting: Orthopedic Surgery

## 2020-08-17 DIAGNOSIS — E785 Hyperlipidemia, unspecified: Secondary | ICD-10-CM | POA: Diagnosis not present

## 2020-08-17 DIAGNOSIS — Z7984 Long term (current) use of oral hypoglycemic drugs: Secondary | ICD-10-CM | POA: Diagnosis not present

## 2020-08-17 DIAGNOSIS — Z471 Aftercare following joint replacement surgery: Secondary | ICD-10-CM | POA: Diagnosis not present

## 2020-08-17 DIAGNOSIS — I5181 Takotsubo syndrome: Secondary | ICD-10-CM | POA: Diagnosis not present

## 2020-08-17 DIAGNOSIS — I252 Old myocardial infarction: Secondary | ICD-10-CM | POA: Diagnosis not present

## 2020-08-17 DIAGNOSIS — I509 Heart failure, unspecified: Secondary | ICD-10-CM | POA: Diagnosis not present

## 2020-08-17 DIAGNOSIS — M5136 Other intervertebral disc degeneration, lumbar region: Secondary | ICD-10-CM | POA: Diagnosis not present

## 2020-08-17 DIAGNOSIS — J449 Chronic obstructive pulmonary disease, unspecified: Secondary | ICD-10-CM | POA: Diagnosis not present

## 2020-08-17 DIAGNOSIS — Z96651 Presence of right artificial knee joint: Secondary | ICD-10-CM | POA: Diagnosis not present

## 2020-08-17 DIAGNOSIS — Z87891 Personal history of nicotine dependence: Secondary | ICD-10-CM | POA: Diagnosis not present

## 2020-08-17 DIAGNOSIS — K219 Gastro-esophageal reflux disease without esophagitis: Secondary | ICD-10-CM | POA: Diagnosis not present

## 2020-08-17 DIAGNOSIS — Z7982 Long term (current) use of aspirin: Secondary | ICD-10-CM | POA: Diagnosis not present

## 2020-08-17 DIAGNOSIS — E039 Hypothyroidism, unspecified: Secondary | ICD-10-CM | POA: Diagnosis not present

## 2020-08-17 DIAGNOSIS — I110 Hypertensive heart disease with heart failure: Secondary | ICD-10-CM | POA: Diagnosis not present

## 2020-08-19 DIAGNOSIS — K219 Gastro-esophageal reflux disease without esophagitis: Secondary | ICD-10-CM | POA: Diagnosis not present

## 2020-08-19 DIAGNOSIS — Z96651 Presence of right artificial knee joint: Secondary | ICD-10-CM | POA: Diagnosis not present

## 2020-08-19 DIAGNOSIS — I509 Heart failure, unspecified: Secondary | ICD-10-CM | POA: Diagnosis not present

## 2020-08-19 DIAGNOSIS — Z7984 Long term (current) use of oral hypoglycemic drugs: Secondary | ICD-10-CM | POA: Diagnosis not present

## 2020-08-19 DIAGNOSIS — I5181 Takotsubo syndrome: Secondary | ICD-10-CM | POA: Diagnosis not present

## 2020-08-19 DIAGNOSIS — Z471 Aftercare following joint replacement surgery: Secondary | ICD-10-CM | POA: Diagnosis not present

## 2020-08-19 DIAGNOSIS — Z87891 Personal history of nicotine dependence: Secondary | ICD-10-CM | POA: Diagnosis not present

## 2020-08-19 DIAGNOSIS — I252 Old myocardial infarction: Secondary | ICD-10-CM | POA: Diagnosis not present

## 2020-08-19 DIAGNOSIS — E785 Hyperlipidemia, unspecified: Secondary | ICD-10-CM | POA: Diagnosis not present

## 2020-08-19 DIAGNOSIS — J449 Chronic obstructive pulmonary disease, unspecified: Secondary | ICD-10-CM | POA: Diagnosis not present

## 2020-08-19 DIAGNOSIS — I110 Hypertensive heart disease with heart failure: Secondary | ICD-10-CM | POA: Diagnosis not present

## 2020-08-19 DIAGNOSIS — Z7982 Long term (current) use of aspirin: Secondary | ICD-10-CM | POA: Diagnosis not present

## 2020-08-19 DIAGNOSIS — M5136 Other intervertebral disc degeneration, lumbar region: Secondary | ICD-10-CM | POA: Diagnosis not present

## 2020-08-19 DIAGNOSIS — E039 Hypothyroidism, unspecified: Secondary | ICD-10-CM | POA: Diagnosis not present

## 2020-08-22 DIAGNOSIS — Z7982 Long term (current) use of aspirin: Secondary | ICD-10-CM | POA: Diagnosis not present

## 2020-08-22 DIAGNOSIS — I252 Old myocardial infarction: Secondary | ICD-10-CM | POA: Diagnosis not present

## 2020-08-22 DIAGNOSIS — M5136 Other intervertebral disc degeneration, lumbar region: Secondary | ICD-10-CM | POA: Diagnosis not present

## 2020-08-22 DIAGNOSIS — K219 Gastro-esophageal reflux disease without esophagitis: Secondary | ICD-10-CM | POA: Diagnosis not present

## 2020-08-22 DIAGNOSIS — Z96651 Presence of right artificial knee joint: Secondary | ICD-10-CM | POA: Diagnosis not present

## 2020-08-22 DIAGNOSIS — I5181 Takotsubo syndrome: Secondary | ICD-10-CM | POA: Diagnosis not present

## 2020-08-22 DIAGNOSIS — Z87891 Personal history of nicotine dependence: Secondary | ICD-10-CM | POA: Diagnosis not present

## 2020-08-22 DIAGNOSIS — Z471 Aftercare following joint replacement surgery: Secondary | ICD-10-CM | POA: Diagnosis not present

## 2020-08-22 DIAGNOSIS — E039 Hypothyroidism, unspecified: Secondary | ICD-10-CM | POA: Diagnosis not present

## 2020-08-22 DIAGNOSIS — J449 Chronic obstructive pulmonary disease, unspecified: Secondary | ICD-10-CM | POA: Diagnosis not present

## 2020-08-22 DIAGNOSIS — Z7984 Long term (current) use of oral hypoglycemic drugs: Secondary | ICD-10-CM | POA: Diagnosis not present

## 2020-08-22 DIAGNOSIS — I110 Hypertensive heart disease with heart failure: Secondary | ICD-10-CM | POA: Diagnosis not present

## 2020-08-22 DIAGNOSIS — E785 Hyperlipidemia, unspecified: Secondary | ICD-10-CM | POA: Diagnosis not present

## 2020-08-22 DIAGNOSIS — I509 Heart failure, unspecified: Secondary | ICD-10-CM | POA: Diagnosis not present

## 2020-08-23 DIAGNOSIS — E039 Hypothyroidism, unspecified: Secondary | ICD-10-CM | POA: Diagnosis not present

## 2020-08-23 DIAGNOSIS — Z96651 Presence of right artificial knee joint: Secondary | ICD-10-CM | POA: Diagnosis not present

## 2020-08-23 DIAGNOSIS — E785 Hyperlipidemia, unspecified: Secondary | ICD-10-CM | POA: Diagnosis not present

## 2020-08-23 DIAGNOSIS — I509 Heart failure, unspecified: Secondary | ICD-10-CM | POA: Diagnosis not present

## 2020-08-23 DIAGNOSIS — Z471 Aftercare following joint replacement surgery: Secondary | ICD-10-CM | POA: Diagnosis not present

## 2020-08-23 DIAGNOSIS — I252 Old myocardial infarction: Secondary | ICD-10-CM | POA: Diagnosis not present

## 2020-08-23 DIAGNOSIS — Z7982 Long term (current) use of aspirin: Secondary | ICD-10-CM | POA: Diagnosis not present

## 2020-08-23 DIAGNOSIS — Z7984 Long term (current) use of oral hypoglycemic drugs: Secondary | ICD-10-CM | POA: Diagnosis not present

## 2020-08-23 DIAGNOSIS — I110 Hypertensive heart disease with heart failure: Secondary | ICD-10-CM | POA: Diagnosis not present

## 2020-08-23 DIAGNOSIS — Z87891 Personal history of nicotine dependence: Secondary | ICD-10-CM | POA: Diagnosis not present

## 2020-08-23 DIAGNOSIS — I5181 Takotsubo syndrome: Secondary | ICD-10-CM | POA: Diagnosis not present

## 2020-08-23 DIAGNOSIS — J449 Chronic obstructive pulmonary disease, unspecified: Secondary | ICD-10-CM | POA: Diagnosis not present

## 2020-08-23 DIAGNOSIS — K219 Gastro-esophageal reflux disease without esophagitis: Secondary | ICD-10-CM | POA: Diagnosis not present

## 2020-08-23 DIAGNOSIS — M5136 Other intervertebral disc degeneration, lumbar region: Secondary | ICD-10-CM | POA: Diagnosis not present

## 2020-08-25 DIAGNOSIS — R309 Painful micturition, unspecified: Secondary | ICD-10-CM | POA: Diagnosis not present

## 2020-08-25 DIAGNOSIS — M1711 Unilateral primary osteoarthritis, right knee: Secondary | ICD-10-CM | POA: Diagnosis not present

## 2020-08-27 DIAGNOSIS — Z96651 Presence of right artificial knee joint: Secondary | ICD-10-CM | POA: Diagnosis not present

## 2020-08-27 DIAGNOSIS — M1711 Unilateral primary osteoarthritis, right knee: Secondary | ICD-10-CM | POA: Diagnosis not present

## 2020-09-26 DIAGNOSIS — M1711 Unilateral primary osteoarthritis, right knee: Secondary | ICD-10-CM | POA: Diagnosis not present

## 2020-09-29 DIAGNOSIS — M1711 Unilateral primary osteoarthritis, right knee: Secondary | ICD-10-CM | POA: Diagnosis not present

## 2020-11-17 DIAGNOSIS — M25562 Pain in left knee: Secondary | ICD-10-CM | POA: Diagnosis not present

## 2020-11-17 DIAGNOSIS — M1711 Unilateral primary osteoarthritis, right knee: Secondary | ICD-10-CM | POA: Diagnosis not present

## 2020-11-18 DIAGNOSIS — J309 Allergic rhinitis, unspecified: Secondary | ICD-10-CM | POA: Diagnosis not present

## 2020-11-18 DIAGNOSIS — E039 Hypothyroidism, unspecified: Secondary | ICD-10-CM | POA: Diagnosis not present

## 2020-11-18 DIAGNOSIS — E7800 Pure hypercholesterolemia, unspecified: Secondary | ICD-10-CM | POA: Diagnosis not present

## 2020-11-18 DIAGNOSIS — I509 Heart failure, unspecified: Secondary | ICD-10-CM | POA: Diagnosis not present

## 2020-11-18 DIAGNOSIS — G4700 Insomnia, unspecified: Secondary | ICD-10-CM | POA: Diagnosis not present

## 2020-11-18 DIAGNOSIS — K219 Gastro-esophageal reflux disease without esophagitis: Secondary | ICD-10-CM | POA: Diagnosis not present

## 2020-11-18 DIAGNOSIS — I2510 Atherosclerotic heart disease of native coronary artery without angina pectoris: Secondary | ICD-10-CM | POA: Diagnosis not present

## 2020-11-18 DIAGNOSIS — E119 Type 2 diabetes mellitus without complications: Secondary | ICD-10-CM | POA: Diagnosis not present

## 2020-11-18 DIAGNOSIS — I10 Essential (primary) hypertension: Secondary | ICD-10-CM | POA: Diagnosis not present

## 2020-11-23 DIAGNOSIS — E119 Type 2 diabetes mellitus without complications: Secondary | ICD-10-CM | POA: Diagnosis not present

## 2020-11-23 DIAGNOSIS — M7551 Bursitis of right shoulder: Secondary | ICD-10-CM | POA: Diagnosis not present

## 2020-12-16 DIAGNOSIS — Z79899 Other long term (current) drug therapy: Secondary | ICD-10-CM | POA: Diagnosis not present

## 2020-12-16 DIAGNOSIS — M25561 Pain in right knee: Secondary | ICD-10-CM | POA: Diagnosis not present

## 2020-12-16 DIAGNOSIS — M25562 Pain in left knee: Secondary | ICD-10-CM | POA: Diagnosis not present

## 2020-12-16 DIAGNOSIS — G8929 Other chronic pain: Secondary | ICD-10-CM | POA: Diagnosis not present

## 2020-12-16 DIAGNOSIS — Z96651 Presence of right artificial knee joint: Secondary | ICD-10-CM | POA: Diagnosis not present

## 2021-01-12 DIAGNOSIS — G8929 Other chronic pain: Secondary | ICD-10-CM | POA: Diagnosis not present

## 2021-01-12 DIAGNOSIS — Z1159 Encounter for screening for other viral diseases: Secondary | ICD-10-CM | POA: Diagnosis not present

## 2021-01-12 DIAGNOSIS — M25562 Pain in left knee: Secondary | ICD-10-CM | POA: Diagnosis not present

## 2021-01-12 DIAGNOSIS — Z96651 Presence of right artificial knee joint: Secondary | ICD-10-CM | POA: Diagnosis not present

## 2021-01-12 DIAGNOSIS — E559 Vitamin D deficiency, unspecified: Secondary | ICD-10-CM | POA: Diagnosis not present

## 2021-01-12 DIAGNOSIS — M25561 Pain in right knee: Secondary | ICD-10-CM | POA: Diagnosis not present

## 2021-01-12 DIAGNOSIS — Z79899 Other long term (current) drug therapy: Secondary | ICD-10-CM | POA: Diagnosis not present

## 2021-01-12 DIAGNOSIS — M129 Arthropathy, unspecified: Secondary | ICD-10-CM | POA: Diagnosis not present

## 2021-01-12 DIAGNOSIS — R030 Elevated blood-pressure reading, without diagnosis of hypertension: Secondary | ICD-10-CM | POA: Diagnosis not present

## 2021-02-13 DIAGNOSIS — Z79899 Other long term (current) drug therapy: Secondary | ICD-10-CM | POA: Diagnosis not present

## 2021-02-13 DIAGNOSIS — R030 Elevated blood-pressure reading, without diagnosis of hypertension: Secondary | ICD-10-CM | POA: Diagnosis not present

## 2021-02-13 DIAGNOSIS — M25562 Pain in left knee: Secondary | ICD-10-CM | POA: Diagnosis not present

## 2021-02-13 DIAGNOSIS — M25561 Pain in right knee: Secondary | ICD-10-CM | POA: Diagnosis not present

## 2021-02-13 DIAGNOSIS — G8929 Other chronic pain: Secondary | ICD-10-CM | POA: Diagnosis not present

## 2021-02-13 DIAGNOSIS — M546 Pain in thoracic spine: Secondary | ICD-10-CM | POA: Diagnosis not present

## 2021-02-16 DIAGNOSIS — Z79899 Other long term (current) drug therapy: Secondary | ICD-10-CM | POA: Diagnosis not present

## 2021-03-18 DIAGNOSIS — R0602 Shortness of breath: Secondary | ICD-10-CM | POA: Diagnosis not present

## 2021-03-18 DIAGNOSIS — M5416 Radiculopathy, lumbar region: Secondary | ICD-10-CM | POA: Diagnosis not present

## 2021-03-18 DIAGNOSIS — J40 Bronchitis, not specified as acute or chronic: Secondary | ICD-10-CM | POA: Diagnosis not present

## 2021-03-18 DIAGNOSIS — R059 Cough, unspecified: Secondary | ICD-10-CM | POA: Diagnosis not present

## 2021-03-18 DIAGNOSIS — Z79899 Other long term (current) drug therapy: Secondary | ICD-10-CM | POA: Diagnosis not present

## 2021-03-22 DIAGNOSIS — E1140 Type 2 diabetes mellitus with diabetic neuropathy, unspecified: Secondary | ICD-10-CM | POA: Diagnosis not present

## 2021-03-22 DIAGNOSIS — Z79899 Other long term (current) drug therapy: Secondary | ICD-10-CM | POA: Diagnosis not present

## 2021-03-22 DIAGNOSIS — Z0000 Encounter for general adult medical examination without abnormal findings: Secondary | ICD-10-CM | POA: Diagnosis not present

## 2021-03-22 DIAGNOSIS — R9431 Abnormal electrocardiogram [ECG] [EKG]: Secondary | ICD-10-CM | POA: Diagnosis not present

## 2021-03-22 DIAGNOSIS — Z1159 Encounter for screening for other viral diseases: Secondary | ICD-10-CM | POA: Diagnosis not present

## 2021-03-22 DIAGNOSIS — E559 Vitamin D deficiency, unspecified: Secondary | ICD-10-CM | POA: Diagnosis not present

## 2021-03-22 DIAGNOSIS — I10 Essential (primary) hypertension: Secondary | ICD-10-CM | POA: Diagnosis not present

## 2021-03-22 DIAGNOSIS — Z1231 Encounter for screening mammogram for malignant neoplasm of breast: Secondary | ICD-10-CM | POA: Diagnosis not present

## 2021-03-22 DIAGNOSIS — E039 Hypothyroidism, unspecified: Secondary | ICD-10-CM | POA: Diagnosis not present

## 2021-03-22 DIAGNOSIS — E7800 Pure hypercholesterolemia, unspecified: Secondary | ICD-10-CM | POA: Diagnosis not present

## 2021-03-22 DIAGNOSIS — R0602 Shortness of breath: Secondary | ICD-10-CM | POA: Diagnosis not present

## 2021-07-24 ENCOUNTER — Other Ambulatory Visit: Payer: Self-pay | Admitting: Nurse Practitioner

## 2021-07-24 DIAGNOSIS — R4689 Other symptoms and signs involving appearance and behavior: Secondary | ICD-10-CM

## 2021-08-22 ENCOUNTER — Other Ambulatory Visit: Payer: Self-pay

## 2021-08-22 ENCOUNTER — Ambulatory Visit
Admission: RE | Admit: 2021-08-22 | Discharge: 2021-08-22 | Disposition: A | Discharge status: Home / Self Care | Payer: Medicare Other | Source: Ambulatory Visit | Attending: Nurse Practitioner | Admitting: Nurse Practitioner

## 2021-08-22 DIAGNOSIS — R4689 Other symptoms and signs involving appearance and behavior: Secondary | ICD-10-CM

## 2021-08-22 MED ORDER — IOPAMIDOL (ISOVUE-300) INJECTION 61%
75.0000 mL | Freq: Once | INTRAVENOUS | Status: AC | PRN
  Administered 2021-08-22: 15:00:00 75 mL via INTRAVENOUS

## 2022-10-25 ENCOUNTER — Encounter: Payer: Self-pay | Admitting: Internal Medicine

## 2023-08-24 DIAGNOSIS — J189 Pneumonia, unspecified organism: Secondary | ICD-10-CM | POA: Diagnosis not present

## 2023-08-24 DIAGNOSIS — R0609 Other forms of dyspnea: Secondary | ICD-10-CM | POA: Diagnosis not present

## 2023-08-24 DIAGNOSIS — I160 Hypertensive urgency: Secondary | ICD-10-CM | POA: Diagnosis not present

## 2023-08-24 DIAGNOSIS — E785 Hyperlipidemia, unspecified: Secondary | ICD-10-CM | POA: Diagnosis not present

## 2023-08-24 DIAGNOSIS — R079 Chest pain, unspecified: Secondary | ICD-10-CM | POA: Diagnosis not present

## 2023-08-25 DIAGNOSIS — I160 Hypertensive urgency: Secondary | ICD-10-CM | POA: Diagnosis not present

## 2023-08-25 DIAGNOSIS — E785 Hyperlipidemia, unspecified: Secondary | ICD-10-CM | POA: Diagnosis not present

## 2023-08-25 DIAGNOSIS — R079 Chest pain, unspecified: Secondary | ICD-10-CM | POA: Diagnosis not present

## 2023-08-25 DIAGNOSIS — J189 Pneumonia, unspecified organism: Secondary | ICD-10-CM | POA: Diagnosis not present
